# Patient Record
Sex: Female | Born: 1985 | Race: White | Hispanic: No | Marital: Married | Smoking: Current every day smoker
Health system: Southern US, Community
[De-identification: ages and names within clinical notes are randomized; demographics above are authoritative.]

## PROBLEM LIST (undated history)

## (undated) DIAGNOSIS — R51 Headache: Secondary | ICD-10-CM

## (undated) DIAGNOSIS — I639 Cerebral infarction, unspecified: Secondary | ICD-10-CM

## (undated) DIAGNOSIS — S0990XA Unspecified injury of head, initial encounter: Secondary | ICD-10-CM

## (undated) DIAGNOSIS — R569 Unspecified convulsions: Secondary | ICD-10-CM

---

## 2004-01-20 ENCOUNTER — Other Ambulatory Visit: Payer: Self-pay

## 2005-01-17 ENCOUNTER — Emergency Department: Payer: Self-pay | Admitting: Emergency Medicine

## 2006-12-13 ENCOUNTER — Emergency Department: Payer: Self-pay | Admitting: Emergency Medicine

## 2008-12-18 ENCOUNTER — Emergency Department: Payer: Self-pay | Admitting: Emergency Medicine

## 2008-12-20 ENCOUNTER — Emergency Department: Payer: Self-pay | Admitting: Emergency Medicine

## 2009-04-14 ENCOUNTER — Emergency Department: Payer: Self-pay | Admitting: Emergency Medicine

## 2009-11-23 ENCOUNTER — Ambulatory Visit: Payer: Self-pay

## 2010-07-03 ENCOUNTER — Emergency Department: Payer: Self-pay | Admitting: Emergency Medicine

## 2011-05-10 ENCOUNTER — Emergency Department: Payer: Self-pay | Admitting: *Deleted

## 2012-01-23 ENCOUNTER — Emergency Department: Payer: Self-pay | Admitting: Emergency Medicine

## 2012-07-13 ENCOUNTER — Emergency Department: Payer: Self-pay | Admitting: Emergency Medicine

## 2012-07-13 LAB — COMPREHENSIVE METABOLIC PANEL
Albumin: 3.9 g/dL (ref 3.4–5.0)
Alkaline Phosphatase: 63 U/L (ref 50–136)
Creatinine: 0.68 mg/dL (ref 0.60–1.30)
EGFR (African American): >60
EGFR (Non-African Amer.): >60
Glucose: 99 mg/dL (ref 65–99)
SGOT(AST): 20 U/L (ref 15–37)
SGPT (ALT): 19 U/L (ref 12–78)
Total Protein: 7.9 g/dL (ref 6.4–8.2)

## 2012-07-13 LAB — CBC
HCT: 35.2 % (ref 35.0–47.0)
MCH: 26.6 pg (ref 26.0–34.0)
MCV: 81 fL (ref 80–100)
RBC: 4.33 10*6/uL (ref 3.80–5.20)
WBC: 13.9 10*3/uL — ABNORMAL HIGH (ref 3.6–11.0)

## 2012-07-13 LAB — PREGNANCY, URINE: Pregnancy Test, Urine: NEGATIVE m[IU]/mL

## 2012-07-13 LAB — URINALYSIS, COMPLETE
Protein: 100
Specific Gravity: 1.015 (ref 1.003–1.030)
Squamous Epithelial: NONE SEEN
WBC UR: 10 /HPF (ref 0–5)

## 2012-07-14 LAB — WET PREP, GENITAL

## 2012-07-21 ENCOUNTER — Emergency Department: Payer: Self-pay | Admitting: Emergency Medicine

## 2012-07-24 ENCOUNTER — Ambulatory Visit: Payer: Self-pay | Admitting: Urology

## 2012-08-06 ENCOUNTER — Ambulatory Visit: Payer: Self-pay | Admitting: Urology

## 2012-08-19 ENCOUNTER — Ambulatory Visit: Payer: Self-pay | Admitting: Urology

## 2012-09-24 ENCOUNTER — Emergency Department: Payer: Self-pay | Admitting: Emergency Medicine

## 2012-09-24 LAB — URINALYSIS, COMPLETE
Glucose,UR: NEGATIVE mg/dL (ref 0–75)
Ketone: NEGATIVE
Leukocyte Esterase: NEGATIVE
Ph: 8 (ref 4.5–8.0)
RBC,UR: <1 /HPF (ref 0–5)
Specific Gravity: 1.008 (ref 1.003–1.030)
Squamous Epithelial: 8

## 2012-09-24 LAB — TROPONIN I: Troponin-I: <0.02 ng/mL

## 2012-09-24 LAB — DRUG SCREEN, URINE
Benzodiazepine, Ur Scrn: NEGATIVE (ref ?–200)
MDMA (Ecstasy)Ur Screen: NEGATIVE (ref ?–500)
Methadone, Ur Screen: NEGATIVE (ref ?–300)

## 2012-09-24 LAB — COMPREHENSIVE METABOLIC PANEL
Albumin: 4.1 g/dL (ref 3.4–5.0)
Alkaline Phosphatase: 54 U/L (ref 50–136)
Anion Gap: 5 — ABNORMAL LOW (ref 7–16)
BUN: 8 mg/dL (ref 7–18)
Bilirubin,Total: 0.3 mg/dL (ref 0.2–1.0)
Chloride: 107 mmol/L (ref 98–107)
Co2: 25 mmol/L (ref 21–32)
Creatinine: 0.59 mg/dL — ABNORMAL LOW (ref 0.60–1.30)
Glucose: 100 mg/dL — ABNORMAL HIGH (ref 65–99)
Osmolality: 272 (ref 275–301)
Potassium: 3.9 mmol/L (ref 3.5–5.1)
Sodium: 137 mmol/L (ref 136–145)
Total Protein: 8.3 g/dL — ABNORMAL HIGH (ref 6.4–8.2)

## 2012-09-24 LAB — CBC
MCV: 82 fL (ref 80–100)
Platelet: 351 10*3/uL (ref 150–440)
RDW: 16.5 % — ABNORMAL HIGH (ref 11.5–14.5)

## 2012-09-24 LAB — ETHANOL: Ethanol %: <0.003 % (ref 0.000–0.080)

## 2012-09-24 LAB — CK TOTAL AND CKMB (NOT AT ARMC): CK, Total: 91 U/L (ref 21–215)

## 2012-11-14 ENCOUNTER — Encounter (HOSPITAL_COMMUNITY): Payer: Self-pay | Admitting: *Deleted

## 2012-11-14 ENCOUNTER — Inpatient Hospital Stay (HOSPITAL_COMMUNITY)
Admission: EM | Admit: 2012-11-14 | Discharge: 2012-11-18 | DRG: 064 | Discharge status: Against Medical Advice | Payer: Medicaid Other | Attending: Internal Medicine | Admitting: Internal Medicine

## 2012-11-14 DIAGNOSIS — F1510 Other stimulant abuse, uncomplicated: Secondary | ICD-10-CM | POA: Diagnosis present

## 2012-11-14 DIAGNOSIS — R930 Abnormal findings on diagnostic imaging of skull and head, not elsewhere classified: Secondary | ICD-10-CM

## 2012-11-14 DIAGNOSIS — G9340 Encephalopathy, unspecified: Secondary | ICD-10-CM | POA: Diagnosis present

## 2012-11-14 DIAGNOSIS — I639 Cerebral infarction, unspecified: Secondary | ICD-10-CM | POA: Diagnosis present

## 2012-11-14 DIAGNOSIS — R569 Unspecified convulsions: Secondary | ICD-10-CM | POA: Diagnosis present

## 2012-11-14 DIAGNOSIS — R4182 Altered mental status, unspecified: Secondary | ICD-10-CM

## 2012-11-14 DIAGNOSIS — F1210 Cannabis abuse, uncomplicated: Secondary | ICD-10-CM | POA: Diagnosis present

## 2012-11-14 DIAGNOSIS — I6350 Cerebral infarction due to unspecified occlusion or stenosis of unspecified cerebral artery: Principal | ICD-10-CM | POA: Diagnosis present

## 2012-11-14 DIAGNOSIS — F1410 Cocaine abuse, uncomplicated: Secondary | ICD-10-CM | POA: Diagnosis present

## 2012-11-14 DIAGNOSIS — F445 Conversion disorder with seizures or convulsions: Secondary | ICD-10-CM | POA: Diagnosis present

## 2012-11-14 DIAGNOSIS — G43909 Migraine, unspecified, not intractable, without status migrainosus: Secondary | ICD-10-CM | POA: Diagnosis present

## 2012-11-14 HISTORY — DX: Unspecified injury of head, initial encounter: S09.90XA

## 2012-11-14 HISTORY — DX: Cerebral infarction, unspecified: I63.9

## 2012-11-14 HISTORY — DX: Headache: R51

## 2012-11-14 HISTORY — DX: Unspecified convulsions: R56.9

## 2012-11-14 LAB — CBC
HCT: 35.7 % — ABNORMAL LOW (ref 36.0–46.0)
Hemoglobin: 12.2 g/dL (ref 12.0–15.0)
MCHC: 34.2 g/dL (ref 30.0–36.0)
MCV: 79.2 fL (ref 78.0–100.0)

## 2012-11-14 LAB — BASIC METABOLIC PANEL
BUN: 14 mg/dL (ref 6–23)
Chloride: 104 mEq/L (ref 96–112)
Glucose, Bld: 92 mg/dL (ref 70–99)
Potassium: 3.6 mEq/L (ref 3.5–5.1)

## 2012-11-14 LAB — GLUCOSE, CAPILLARY

## 2012-11-14 LAB — RAPID URINE DRUG SCREEN, HOSP PERFORMED
Amphetamines: NOT DETECTED
Barbiturates: NOT DETECTED
Benzodiazepines: NOT DETECTED

## 2012-11-14 LAB — ETHANOL: Alcohol, Ethyl (B): <11 mg/dL (ref 0–11)

## 2012-11-14 MED ORDER — LORAZEPAM 2 MG/ML IJ SOLN
1.0000 mg | Freq: Once | INTRAMUSCULAR | Status: AC
  Administered 2012-11-14: 1 mg via INTRAVENOUS
  Filled 2012-11-14: qty 1

## 2012-11-14 NOTE — ED Notes (Addendum)
~   1000: started babbling yesterday morning; corner of rt. Cheek twitching. Lasted x 2 hrs. Today: ~1030, 1800: x 2 seizure like symptoms. Dx. With seizures and anxiety attacks. X 3 mos. Had similar event, went to hospital and dx. Head bleed.

## 2012-11-14 NOTE — ED Notes (Signed)
Pt moving all extremities on own with no difficulty. Asked husband for water to drink. Pt informed if head CT looks good she can have something to drink after that.

## 2012-11-15 ENCOUNTER — Observation Stay (HOSPITAL_COMMUNITY): Payer: Medicaid Other

## 2012-11-15 ENCOUNTER — Encounter (HOSPITAL_COMMUNITY): Payer: Self-pay | Admitting: Internal Medicine

## 2012-11-15 ENCOUNTER — Emergency Department (HOSPITAL_COMMUNITY): Payer: Medicaid Other

## 2012-11-15 DIAGNOSIS — G43909 Migraine, unspecified, not intractable, without status migrainosus: Secondary | ICD-10-CM | POA: Diagnosis present

## 2012-11-15 DIAGNOSIS — R930 Abnormal findings on diagnostic imaging of skull and head, not elsewhere classified: Secondary | ICD-10-CM

## 2012-11-15 DIAGNOSIS — R4182 Altered mental status, unspecified: Secondary | ICD-10-CM

## 2012-11-15 DIAGNOSIS — G9340 Encephalopathy, unspecified: Secondary | ICD-10-CM

## 2012-11-15 DIAGNOSIS — F445 Conversion disorder with seizures or convulsions: Secondary | ICD-10-CM | POA: Diagnosis present

## 2012-11-15 LAB — BASIC METABOLIC PANEL
CO2: 23 mEq/L (ref 19–32)
Calcium: 9 mg/dL (ref 8.4–10.5)
Creatinine, Ser: 0.7 mg/dL (ref 0.50–1.10)
GFR calc Af Amer: >90 mL/min (ref >90)
Sodium: 138 mEq/L (ref 135–145)

## 2012-11-15 LAB — CBC
MCV: 79.4 fL (ref 78.0–100.0)
Platelets: 321 10*3/uL (ref 150–400)
RBC: 3.94 MIL/uL (ref 3.87–5.11)
RDW: 16.4 % — ABNORMAL HIGH (ref 11.5–15.5)
WBC: 8.4 10*3/uL (ref 4.0–10.5)

## 2012-11-15 LAB — URINE MICROSCOPIC-ADD ON

## 2012-11-15 LAB — ANTITHROMBIN III: AntiThromb III Func: 102 % (ref 75–120)

## 2012-11-15 LAB — URINALYSIS, ROUTINE W REFLEX MICROSCOPIC
Leukocytes, UA: NEGATIVE
Nitrite: NEGATIVE
Specific Gravity, Urine: 1.031 — ABNORMAL HIGH (ref 1.005–1.030)
pH: 6 (ref 5.0–8.0)

## 2012-11-15 LAB — HOMOCYSTEINE: Homocysteine: 8.1 umol/L (ref 4.0–15.4)

## 2012-11-15 MED ORDER — ACETAMINOPHEN 325 MG PO TABS
650.0000 mg | ORAL_TABLET | Freq: Four times a day (QID) | ORAL | Status: DC | PRN
  Administered 2012-11-15 – 2012-11-16 (×2): 650 mg via ORAL
  Filled 2012-11-15 (×2): qty 2

## 2012-11-15 MED ORDER — HYDROMORPHONE HCL PF 1 MG/ML IJ SOLN: 0.5000 mg | INTRAMUSCULAR | Status: DC | PRN

## 2012-11-15 MED ORDER — ONDANSETRON HCL 4 MG/2ML IJ SOLN: 4.0000 mg | Freq: Four times a day (QID) | INTRAMUSCULAR | Status: DC | PRN

## 2012-11-15 MED ORDER — ZOLPIDEM TARTRATE 5 MG PO TABS: 5.0000 mg | ORAL_TABLET | Freq: Every evening | ORAL | Status: DC | PRN

## 2012-11-15 MED ORDER — OXYCODONE HCL 5 MG PO TABS
5.0000 mg | ORAL_TABLET | ORAL | Status: DC | PRN
  Administered 2012-11-15 – 2012-11-17 (×5): 5 mg via ORAL
  Filled 2012-11-15 (×5): qty 1

## 2012-11-15 MED ORDER — ENOXAPARIN SODIUM 150 MG/ML ~~LOC~~ SOLN: 1.0000 mg/kg | Freq: Two times a day (BID) | SUBCUTANEOUS | Status: DC

## 2012-11-15 MED ORDER — LORAZEPAM 2 MG/ML IJ SOLN: 1.0000 mg | INTRAMUSCULAR | Status: DC | PRN

## 2012-11-15 MED ORDER — SODIUM CHLORIDE 0.9 % IV SOLN
INTRAVENOUS | Status: DC
  Administered 2012-11-15 – 2012-11-16 (×2): via INTRAVENOUS

## 2012-11-15 MED ORDER — SODIUM CHLORIDE 0.9 % IV SOLN: INTRAVENOUS | Status: AC

## 2012-11-15 MED ORDER — SENNOSIDES-DOCUSATE SODIUM 8.6-50 MG PO TABS: 1.0000 | ORAL_TABLET | Freq: Every evening | ORAL | Status: DC | PRN

## 2012-11-15 MED ORDER — ONDANSETRON HCL 4 MG PO TABS: 4.0000 mg | ORAL_TABLET | Freq: Four times a day (QID) | ORAL | Status: DC | PRN

## 2012-11-15 MED ORDER — ACETAMINOPHEN 650 MG RE SUPP: 650.0000 mg | Freq: Four times a day (QID) | RECTAL | Status: DC | PRN

## 2012-11-15 MED ORDER — ALUM & MAG HYDROXIDE-SIMETH 200-200-20 MG/5ML PO SUSP
30.0000 mL | Freq: Four times a day (QID) | ORAL | Status: DC | PRN
Start: 2012-11-15 — End: 2012-11-18
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
## 2012-11-15 NOTE — Progress Notes (Signed)
Critical value MRI result called to this RN. Paged Dr Roseanne Reno and he has reviewed the test and is aware of results. Regine Christian, [place] Marie, RN
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
## 2012-11-15 NOTE — Progress Notes (Signed)
Pt passed swallow screen with no problems.  Placed on heart healthy diet per previous order.

## 2012-11-15 NOTE — ED Provider Notes (Signed)
History     CSN: [id number]  Arrival date & time 11/14/12  2022   First MD Initiated Contact with Patient 11/14/12 2057      Chief Complaint  Patient presents with  . Altered Mental Status  . Seizures     The history is provided by the spouse. History limited by: Level 5 caveat: Altered mental status.   the majority of the history is obtained from the spouse.  It sounds as though the patient yesterday developed altered mental status and some abnormal speech patterns.  Most of the day the patient has had abnormal speech described as slurring at times by the spouse.  Is also reported that she maybe had 2 seizure-like episodes today.  As reported that she has a seizure disorder but does not take any medications.  He also reports to me that 3 months ago she was admitted the hospital after an episode of passing out at which point they stated that she had bleeding in her head.  The patient left the hospital without a clear diagnosis and prior to her complete workup being finished.  Spouse is reported the patient has a history of anxiety.  He reports that today she's been confused most the day.  He denies illicit drug use for her.  The patient is unable to provide any additional history and will only mumble a few words.  She'll not make direct eye contact.  Past Medical History  Diagnosis Date  . Seizures   . Head trauma     Past Surgical History  Procedure Laterality Date  . Cesarean section      No family history on file.  History  Substance Use Topics  . Smoking status: Not on file  . Smokeless tobacco: Not on file  . Alcohol Use: Yes    OB History   Grav Para Term Preterm Abortions TAB SAB Ect Mult Living                  Review of Systems  Unable to perform ROS: Mental status change  Neurological: Positive for seizures.  Psychiatric/Behavioral: Positive for altered mental status.    Allergies  Bee venom and Penicillins  Home Medications  No current outpatient  prescriptions on file.  BP 109/66  Pulse 69  Temp(Src) 98.8 F (37.1 C) (Oral)  Resp 20  SpO2 96%  LMP 10/15/2012  Physical Exam  Nursing note and vitals reviewed. Constitutional: She appears well-developed and well-nourished. No distress.  HENT:  Head: Normocephalic and atraumatic.  Eyes: EOM are normal.  Neck: Normal range of motion.  Cardiovascular: Normal rate, regular rhythm and normal heart sounds.   Pulmonary/Chest: Effort normal and breath sounds normal.  Abdominal: Soft. She exhibits no distension. There is no tenderness.  Musculoskeletal: Normal range of motion.  Neurological:  Opens eyes to voice and painful stimuli.  We'll move extremities grossly however has weakness of her upper lower extremities but will not strike herself in the face with either hand when her hands lifted above her head.  Skin: Skin is warm and dry.  Psychiatric: She has a normal mood and affect. Judgment normal.    ED Course  Procedures (including critical care time)  Labs Reviewed  GLUCOSE, CAPILLARY - Abnormal; Notable for the following:    Glucose-Capillary 106 (*)    All other components within normal limits  CBC - Abnormal; Notable for the following:    WBC 10.8 (*)    HCT 35.7 (*)    RDW  16.6 (*)    All other components within normal limits  URINE RAPID DRUG SCREEN (HOSP PERFORMED) - Abnormal; Notable for the following:    Tetrahydrocannabinol POSITIVE (*)    All other components within normal limits  BASIC METABOLIC PANEL  ETHANOL   Ct Head Wo Contrast  11/15/2012  *RADIOLOGY REPORT*  Clinical Data: Seizure  CT HEAD WITHOUT CONTRAST  Technique:  Contiguous axial images were obtained from the base of the skull through the vertex without contrast.  Comparison: None.  Findings: Focal hypoattenuation centered at the genu and posterior limb of the left internal capsule/anterior margin of the thalamus. No intraparenchymal hemorrhage, mass, mass effect, or abnormal extra-axial fluid  collection.  No hydrocephalous. The visualized paranasal sinuses and mastoid air cells are predominately clear.  IMPRESSION: Nonspecific hypodensity left basal ganglia, favored to reflect sequelae of prior infarct/hemorrhage.  Recommend correlation with outside imaging and if clinical concern for acute pathology persists, MRI recommended.  Discussed via telephone with Dr. Patria Mane at 12:05 a.m. on 11/15/2012.   Original Report Authenticated By: [name], M.D.    I personally reviewed the imaging tests through PACS system I reviewed available ER/hospitalization records through the EMR   No diagnosis found.    MDM  Neurology to assist in this difficult situation.  I suspect this is a chronic finding in her head and that this is nonacute.  Her symptoms seem more consistent with somatic disorder versus non-atelectatic seizure.  Will await neuro consultation for additional disposition decisions.        Lyanne Co, MD 11/15/12 [phone]
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
## 2012-11-15 NOTE — ED Provider Notes (Signed)
1:40 AM patient is alert speech is slurred, moves all extremities spoke with Dr. Amada Jupiter who suggests admission to medical service MRI.MRA scan of the brain and EEG. Spoke with Dr. Lovell Sheehan will arrange for inpatient stay  Doug Sou, MD 11/15/12 [phone]

## 2012-11-15 NOTE — Progress Notes (Signed)
Subjective: This is an unfortunate 27 year old female who was admitted to [facility] early this morning for possible seizure activity. Please refer to Dr. Alene Mires full consult note from 2:30 this morning. An MRI/MRA was recommended as part of the patient's workup. The MRI has come back consistent with a CVA. We were contacted by the hospitalists who reported that the patient appeared to be very lethargic and were asked to reevaluate her this afternoon. There were no family members present when seen.   From Dr Alene Mires note:  "Sheri Woods is a 27 y.o. 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 She was previously treated with antiepileptic medications, but has not been on one for 1.5 years. She had an episode 3 months ago, the details of which are not clear because the husband was not with her at that time, that prompted her to go to [facility] where she was told that she had a brain hemorrhage. She left AMA prior to being fully evaluated. And recovered over the next few days."   Objective: Current vital signs: BP 119/71  Pulse 59  Temp(Src) 98 F (36.7 C) (Oral)  Resp 17  Ht 5\' 1"  (1.549 m)  Wt 49 kg (108 lb 0.4 oz)  BMI 20.42 kg/m2  SpO2 99%  LMP 10/15/2012 Vital signs in last 24 hours: Temp:  [98 F (36.7 C)-98.8 F (37.1 C)] 98 F (36.7 C) (05/04 0800) Pulse Rate:  [59-86] 59 (05/04 1332) Resp:  [11-20] 17 (05/04 1332) BP: (107-141)/(66-94) 119/71 mmHg (05/04 1332) SpO2:  [93 %-100 %] 99 % (05/04 1332) Weight:  [49 kg (108 lb 0.4 oz)] 49 kg (108 lb 0.4 oz) (05/04 0507)  Intake/Output from previous day: 05/03 0701 - 05/04 0700 In: 120 [P.O.:120] Out: -  Intake/Output this shift:   Nutritional status: Cardiac  Physical Exam  General - this is a tearful 27 year old female lying in bed and somewhat uncooperative with the exam. Heart - Regular rate and rhythm - no murmer Lungs - Clear to auscultation Abdomen -  Soft - non tender Extremities - Distal pulses intact - no edema Skin - Warm and dry  Neurologic Exam:  MENTAL STATUS: The patient was initially lethargic although it appeared that she chose not to communicate. With continued prompting the patient became more cooperative. She appeared to be somewhat confused and was unable to tell me the name of the city although she knew she was in the hospital. She told me she had no idea of the current date. Some conversation appear to be nonsensical. She was able to follow some commands; although, at other times she expressed inability to carry out some tasks before she even made an attempt. CRANIAL NERVES: pupils equal and reactive to light, visual fields full to confrontation, extraocular muscles intact, no nystagmus, facial strength symmetric, uvula midline, tongue midline. The patient's speech was somewhat difficult to understand. Question dysarthria versus speech impediment. The patient was also very soft-spoken. MOTOR: normal bulk and tone, the patient told me that she was unable to lift her arms or her legs. When I lifted her arms she was able to them up against gravity. When I lifted her legs she was unable to keep them elevated. SENSORY: The patient reported decreased sensation in the left upper extremity and decreased sensation in the right lower extremity. COORDINATION: finger-nose-finger normal but very slow. REFLEXES: deep tendon reflexes present, symmetric and brisk - no babinski bilaterally. GAIT/STATION: Deferred  Lab Results: Basic Metabolic Panel:  Recent Labs Lab 11/14/12 2040 11/15/12 0500  NA 140 138  K 3.6 3.6  CL 104 104  CO2 25 23  GLUCOSE 92 83  BUN 14 14  CREATININE 0.72 0.70  CALCIUM 9.9 9.0    Liver Function Tests: No results found for this basename: AST, ALT, ALKPHOS, BILITOT, PROT, ALBUMIN,  in the last 168 hours No results found for this basename: LIPASE, AMYLASE,  in the last 168 hours No results found for this  basename: AMMONIA,  in the last 168 hours  CBC:  Recent Labs Lab 11/14/12 2040 11/15/12 0500  WBC 10.8* 8.4  HGB 12.2 10.7*  HCT 35.7* 31.3*  MCV 79.2 79.4  PLT 362 321    Cardiac Enzymes: No results found for this basename: CKTOTAL, CKMB, CKMBINDEX, TROPONINI,  in the last 168 hours  Lipid Panel: No results found for this basename: CHOL, TRIG, HDL, CHOLHDL, VLDL, LDLCALC,  in the last 168 hours  CBG:  Recent Labs Lab 11/14/12 2033  GLUCAP 106*    Microbiology: No results found for this or any previous visit.  Coagulation Studies: No results found for this basename: LABPROT, INR,  in the last 72 hours  Imaging:  Dg Chest 2 View 11/15/2012  No active disease.  Mild elevation of the right hemidiaphragm.   Ct Head Wo Contrast 11/15/2012  Nonspecific hypodensity left basal ganglia, favored to reflect sequelae of prior infarct/hemorrhage.  Recommend correlation with outside imaging and if clinical concern for acute pathology persists, MRI recommended.    Mr Maxine Glenn Head Wo Contrast 11/15/12 Branch vessel irregularity.  This may reflect limitation of the present exam.  Intracranial atherosclerotic type changes or vasculitis not entirely excluded.  Asymmetric caliber of the internal carotid arteries, larger on the left. Etiology indeterminate.   No pleated appearance of the left internal carotid artery as can be seen with fibromuscular dysplasia.    Mr Brain Wo Contrast 11/15/2012   Abnormal exam with appearance suggestive of acute/subacute infarct involving the left thalamus/posterior limb left internal capsule junction.  This extends to the genu of the internal capsule and there is mild mass effect upon the third ventricle.     Medications:  Scheduled: . sodium chloride   Intravenous STAT    Assessment/Plan:  I discussed the results of the MRI with Dr. Roseanne Reno. He did not feel that there was any need to be overly concerned with the findings at this time. The patient will  need a full stroke workup as well as evaluation from the therapists.   We will have the stroke service evaluate the patient tomorrow for further recommendations. Consider starting aspirin.  As noted the patient was tearful through most of the interview today. I asked her if she was upset about being in the hospital. She denied this as a cause of her distress. She apparently did not want to talk about what is bothering her. I offered to have her meet with a pastor or another counselor; although, she declined at this time.  Delton See PA-C [facility] Pager [phone]  I personally participated in this patient's evaluation and management including the above assessment and management recommendations.   Venetia Maxon M.D. [facility] [phone]  11/15/2012, 3:17 PM

## 2012-11-15 NOTE — H&P (Signed)
[facility] History and Physical  Sheri Woods AVW:[id number] DOB: 1985/07/17 DOA: 11/14/2012  Referring physician:  EDP PCP: No PCP Per Patient  Specialists:   Chief Complaint:  Confusion  HPI: Sheri Woods is a 27 y.o. female who was brought to the ED due to confusion and "talking out of her head" since yesterday according to her husband.  The episodes have been intermittent occuring 3 separate times. She has not had fevers or chills or nausea or vomiting.  She has a history of Migraine headaches, and Seizures Versus Pseudoseizures, and was diagnosed with an Intracranial bleed 3 months ago at [facility].   She was evaluated in the ED and seen by Neurologist Dr. Amada Jupiter and recommendations were made for further evaluations which include an EEG study and an MRI/MRA of the Brain.   The history has been obtained from the husband, patient is confused and unable to give a history at this time.     Review of Systems: The patient denies anorexia, fever,chills, weight loss, vision loss, decreased hearing, hoarseness, chest pain, syncope, dyspnea on exertion, peripheral edema, balance deficits, hemoptysis, abdominal pain,nausea, vomiting, diarrhea, constipation, melena, hematochezia, severe indigestion/heartburn, hematuria, incontinence, dysuria, muscle weakness, suspicious skin lesions, transient blindness, difficulty walking, depression, unusual weight change, abnormal bleeding, enlarged lymph nodes, angioedema, and breast masses.    Past Medical History  Diagnosis Date  . Seizures   . Head trauma     Past Surgical History  Procedure Laterality Date  . Cesarean section       Medications:  HOME MEDS: Prior to Admission medications   Not on File    Allergies:  Allergies  Allergen Reactions  . Bee Venom Hives  . Penicillins Other (See Comments)    Reaction unknown per family     Social History:   reports that  drinks alcohol. She reports that she does  not use illicit drugs. Her tobacco history is not on file.   Family History:  Not Known to Husband    Physical Exam:  GEN:  Thin Caucasian Female examined  and in no acute distress; cooperative with exam Filed Vitals:   11/14/12 2200 11/14/12 2230 11/14/12 2244 11/15/12 0246  BP:   109/66 109/67  Pulse: 67 71 69 65  Temp:      TempSrc:      Resp: 16 19 20 16   SpO2: 100% 99% 96% 98%   Blood pressure 109/67, pulse 65, temperature 98.8 F (37.1 C), temperature source Oral, resp. rate 16, last menstrual period 10/15/2012, SpO2 98.00%. PSYCH: She is alert and oriented x4;  Her speech is slow and deliberate.   does not appear anxious does not appear depressed; affect is normal HEENT: Normocephalic and Atraumatic, Mucous membranes pink; PERRLA; EOM intact; Fundi:  Benign;  No scleral icterus, Nares: Patent, Oropharynx: Clear, Fair Dentition, Neck:  FROM, no cervical lymphadenopathy nor thyromegaly or carotid bruit; no JVD; Breasts:: Not examined CHEST WALL: No tenderness CHEST: Normal respiration, clear to auscultation bilaterally HEART: Regular rate and rhythm; no murmurs rubs or gallops BACK: No kyphosis or scoliosis; no CVA tenderness ABDOMEN: Positive Bowel Sounds, Scaphoid,Soft non-tender; no masses, no organomegaly.    Rectal Exam: Not done EXTREMITIES: No cyanosis, clubbing or edema; no ulcerations. Genitalia: not examined PULSES: 2+ and symmetric SKIN: Normal hydration no rash or ulceration CNS: Cranial nerves 2-12 grossly intact no focal neurologic deficit     Labs & Imaging Results for orders placed during the hospital encounter of 11/14/12 (from the  past 48 hour(s))  GLUCOSE, CAPILLARY     Status: Abnormal   Collection Time    11/14/12  8:33 PM      Result Value Range   Glucose-Capillary 106 (*) 70 - 99 mg/dL   Comment 1 Documented in Chart     Comment 2 Notify RN    CBC     Status: Abnormal   Collection Time    11/14/12  8:40 PM      Result Value Range   WBC  10.8 (*) 4.0 - 10.5 K/uL   RBC 4.51  3.87 - 5.11 MIL/uL   Hemoglobin 12.2  12.0 - 15.0 g/dL   HCT 29.5 (*) 62.1 - 30.8 %   MCV 79.2  78.0 - 100.0 fL   MCH 27.1  26.0 - 34.0 pg   MCHC 34.2  30.0 - 36.0 g/dL   RDW 65.7 (*) 84.6 - 96.2 %   Platelets 362  150 - 400 K/uL  BASIC METABOLIC PANEL     Status: None   Collection Time    11/14/12  8:40 PM      Result Value Range   Sodium 140  135 - 145 mEq/L   Potassium 3.6  3.5 - 5.1 mEq/L   Chloride 104  96 - 112 mEq/L   CO2 25  19 - 32 mEq/L   Glucose, Bld 92  70 - 99 mg/dL   BUN 14  6 - 23 mg/dL   Creatinine, Ser 9.52  0.50 - 1.10 mg/dL   Calcium 9.9  8.4 - 84.1 mg/dL   GFR calc non Af Amer >90  >90 mL/min   GFR calc Af Amer >90  >90 mL/min   Comment:            The eGFR has been calculated     using the CKD EPI equation.     This calculation has not been     validated in all clinical     situations.     eGFR's persistently     <90 mL/min signify     possible Chronic Kidney Disease.  ETHANOL     Status: None   Collection Time    11/14/12  8:40 PM      Result Value Range   Alcohol, Ethyl (B) <11  0 - 11 mg/dL   Comment:            LOWEST DETECTABLE LIMIT FOR     SERUM ALCOHOL IS 11 mg/dL     FOR MEDICAL PURPOSES ONLY  URINE RAPID DRUG SCREEN (HOSP PERFORMED)     Status: Abnormal   Collection Time    11/14/12  9:57 PM      Result Value Range   Opiates NONE DETECTED  NONE DETECTED   Cocaine NONE DETECTED  NONE DETECTED   Benzodiazepines NONE DETECTED  NONE DETECTED   Amphetamines NONE DETECTED  NONE DETECTED   Tetrahydrocannabinol POSITIVE (*) NONE DETECTED   Barbiturates NONE DETECTED  NONE DETECTED   Comment:            DRUG SCREEN FOR MEDICAL PURPOSES     ONLY.  IF CONFIRMATION IS NEEDED     FOR ANY PURPOSE, NOTIFY LAB     WITHIN 5 DAYS.                LOWEST DETECTABLE LIMITS     FOR URINE DRUG SCREEN     Drug Class       Cutoff (ng/mL)  Amphetamine      1000     Barbiturate      200     Benzodiazepine   200      Tricyclics       300     Opiates          300     Cocaine          300     THC              50     Radiological Exams on Admission: Ct Head Wo Contrast  11/15/2012  *RADIOLOGY REPORT*  Clinical Data: Seizure  CT HEAD WITHOUT CONTRAST  Technique:  Contiguous axial images were obtained from the base of the skull through the vertex without contrast.  Comparison: None.  Findings: Focal hypoattenuation centered at the genu and posterior limb of the left internal capsule/anterior margin of the thalamus. No intraparenchymal hemorrhage, mass, mass effect, or abnormal extra-axial fluid collection.  No hydrocephalous. The visualized paranasal sinuses and mastoid air cells are predominately clear.  IMPRESSION: Nonspecific hypodensity left basal ganglia, favored to reflect sequelae of prior infarct/hemorrhage.  Recommend correlation with outside imaging and if clinical concern for acute pathology persists, MRI recommended.  Discussed via telephone with Dr. Patria Mane at 12:05 a.m. on 11/15/2012.   Original Report Authenticated By: [name], M.D.       Assessment/Plan Principal Problem:   Encephalopathy acute Active Problems:   Altered mental status   Abnormal CT scan of head   Pseudoseizures   Migraine headache   1.   Acute Encephalopathy-  ? Seizure Related and post ictal-  Seizure Precautions and Workup initiated,  Neuro checks, and MRI/MRA and EEG in AM.   PRN IV Ativan.  2.   Abnormal CT scan of the brain- Previous ICH 3 months ago, MRI/MRA ordered, and request records from [place].    3.  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    4.  Migraine HAs-  ? Hx of Complex Migraines.   PRN Pain Medication.    5.  DVt prophylaxis with SCDs.       Code Status:  FULL CODE Family Communication:  Family at Bedside Disposition Plan:  Return To Home  Time spent:  72 Minutes  Ron Parker [facility] Pager [phone]  If 7PM-7AM, please contact night-coverage [email or web address] Password  [email or web address] 11/15/2012, 3:30 AM

## 2012-11-15 NOTE — Progress Notes (Signed)
Utilization Review Completed.   Sanaz Scarlett, RN, BSN Nurse Case Manager  [phone]  

## 2012-11-16 ENCOUNTER — Observation Stay (HOSPITAL_COMMUNITY): Payer: Medicaid Other

## 2012-11-16 DIAGNOSIS — G9340 Encephalopathy, unspecified: Secondary | ICD-10-CM

## 2012-11-16 DIAGNOSIS — I6350 Cerebral infarction due to unspecified occlusion or stenosis of unspecified cerebral artery: Secondary | ICD-10-CM

## 2012-11-16 DIAGNOSIS — I639 Cerebral infarction, unspecified: Secondary | ICD-10-CM | POA: Diagnosis present

## 2012-11-16 DIAGNOSIS — F1410 Cocaine abuse, uncomplicated: Secondary | ICD-10-CM | POA: Diagnosis present

## 2012-11-16 DIAGNOSIS — F1510 Other stimulant abuse, uncomplicated: Secondary | ICD-10-CM | POA: Diagnosis present

## 2012-11-16 DIAGNOSIS — R4182 Altered mental status, unspecified: Secondary | ICD-10-CM

## 2012-11-16 LAB — PROTEIN S, TOTAL: Protein S Ag, Total: 71 % (ref 60–150)

## 2012-11-16 LAB — ANTI-SCLERODERMA ANTIBODY: Scleroderma (Scl-70) (ENA) Antibody, IgG: <1 AU/mL (ref <30)

## 2012-11-16 LAB — LIPID PANEL
Cholesterol: 136 mg/dL (ref 0–200)
HDL: 38 mg/dL — ABNORMAL LOW (ref >39)
Total CHOL/HDL Ratio: 3.6 RATIO

## 2012-11-16 LAB — HEMOGLOBIN A1C: Mean Plasma Glucose: 120 mg/dL — ABNORMAL HIGH (ref <117)

## 2012-11-16 LAB — LUPUS ANTICOAGULANT PANEL
DRVVT: 36.3 secs (ref <42.9)
PTT Lupus Anticoagulant: 37.1 secs (ref 28.0–43.0)

## 2012-11-16 LAB — FACTOR 5 LEIDEN

## 2012-11-16 LAB — ANA: Anti Nuclear Antibody(ANA): NEGATIVE

## 2012-11-16 LAB — CARDIOLIPIN ANTIBODIES, IGG, IGM, IGA
Anticardiolipin IgA: 3 APL U/mL — ABNORMAL LOW (ref <22)
Anticardiolipin IgM: 1 MPL U/mL — ABNORMAL LOW (ref <11)

## 2012-11-16 NOTE — Progress Notes (Signed)
Speaking with patient this morning, she stated that she has changed her mind and now does not want her aunt to be given information about her care.

## 2012-11-16 NOTE — Plan of Care (Signed)
Problem: Phase II Progression Outcomes Goal: Discharge plan established No follow up OT recommended at this time after acute care d/c     

## 2012-11-16 NOTE — Progress Notes (Signed)
EEG  Completed; results pending. 

## 2012-11-16 NOTE — Progress Notes (Signed)
[facility] PROGRESS NOTE  Assessment/Plan: Encephalopathy acute - ct head 5.3.2014: hypodensity left basal ganglia - UDS cannabis.patient admitted to cocaine < 1 week ago and amphetamines Dc'd IV naroctic and BZD's - neurology consulted. - MRI positive for a CVA, ANA, hypercoagulable panel.  Pseudoseizures - none here, on telemetry. - check a prolactin level with in 2- minutes if she has seiZures. - EEG.   Migraine headache - PRN medication, avoid narcotics.    Code Status: full Family Communication: boyfriend  Disposition Plan: home in 1-2 days   Consultants:  neuro  Procedures:  MRI  EEG  echo  Antibiotics:  None  HPI/Subjective: Boyfriend  And patient. Admitted to use drugs  Objective: Filed Vitals:   11/15/12 2200 11/16/12 0200 11/16/12 0600 11/16/12 0958  BP: 126/71 131/75 133/73 127/84  Pulse: 68 64 65 58  Temp: 98.9 F (37.2 C) 98.1 F (36.7 C) 98 F (36.7 C) 97.5 F (36.4 C)  TempSrc:    Oral  Resp: 16 16 16 18   Height:      Weight:      SpO2: 100% 100% 100% 100%   No intake or output data in the 24 hours ending 11/16/12 1043 Filed Weights   11/15/12 0507  Weight: 49 kg (108 lb 0.4 oz)    Exam:  General: Alert, awake, oriented x2, in no acute distress.  HEENT: No bruits, no goiter.  Heart: Regular rate and rhythm, without murmurs, rubs, gallops.  Lungs: Good air movement, bilateral air movement.  Abdomen: Soft, nontender, nondistended, positive bowel sounds.  Neuro: Grossly intact, nonfocal.   Data Reviewed: Basic Metabolic Panel:  Recent Labs Lab 11/14/12 2040 11/15/12 0500  NA 140 138  K 3.6 3.6  CL 104 104  CO2 25 23  GLUCOSE 92 83  BUN 14 14  CREATININE 0.72 0.70  CALCIUM 9.9 9.0   Liver Function Tests: No results found for this basename: AST, ALT, ALKPHOS, BILITOT, PROT, ALBUMIN,  in the last 168 hours No results found for this basename: LIPASE, AMYLASE,  in the last 168 hours No results found for this  basename: AMMONIA,  in the last 168 hours CBC:  Recent Labs Lab 11/14/12 2040 11/15/12 0500  WBC 10.8* 8.4  HGB 12.2 10.7*  HCT 35.7* 31.3*  MCV 79.2 79.4  PLT 362 321   Cardiac Enzymes: No results found for this basename: CKTOTAL, CKMB, CKMBINDEX, TROPONINI,  in the last 168 hours BNP (last 3 results) No results found for this basename: PROBNP,  in the last 8760 hours CBG:  Recent Labs Lab 11/14/12 2033  GLUCAP 106*    No results found for this or any previous visit (from the past 240 hour(s)).   Studies: Dg Chest 2 View  11/15/2012  *RADIOLOGY REPORT*  Clinical Data: Stroke  CHEST - 2 VIEW  Comparison: None.  Findings: Cardiomediastinal silhouette is unremarkable.  Mild elevation of the right hemidiaphragm.  No acute infiltrate or pleural effusion.  No pulmonary edema.  IMPRESSION: No active disease.  Mild elevation of the right hemidiaphragm.   Original Report Authenticated By: [name], M.D.    Ct Head Wo Contrast  11/15/2012  *RADIOLOGY REPORT*  Clinical Data: Seizure  CT HEAD WITHOUT CONTRAST  Technique:  Contiguous axial images were obtained from the base of the skull through the vertex without contrast.  Comparison: None.  Findings: Focal hypoattenuation centered at the genu and posterior limb of the left internal capsule/anterior margin of the thalamus. No intraparenchymal hemorrhage, mass, mass  effect, or abnormal extra-axial fluid collection.  No hydrocephalous. The visualized paranasal sinuses and mastoid air cells are predominately clear.  IMPRESSION: Nonspecific hypodensity left basal ganglia, favored to reflect sequelae of prior infarct/hemorrhage.  Recommend correlation with outside imaging and if clinical concern for acute pathology persists, MRI recommended.  Discussed via telephone with Dr. Patria Mane at 12:05 a.m. on 11/15/2012.   Original Report Authenticated By: [name], M.D.    Mr [facility] Wo Contrast  11/15/2012  *RADIOLOGY REPORT*  Clinical Data:  27  year old female with possible seizures versus pseudo seizures for past 3-4 years. Off seizure medication for past 18 months.  3 months ago seizure episode and evaluated at outside hospital and told she had brain hemorrhage.  The patient left against medical advice and recovered within next few days.  Recent development of abnormal speech.  MRI BRAIN WITHOUT CONTRAST MRA HEAD WITHOUT CONTRAST  Technique: Multiplanar, multiecho pulse sequences of the brain and surrounding structures were obtained according to standard protocol without intravenous contrast.  Angiographic images of the head were obtained using MRA technique without contrast.  Comparison: 11/14/2012 head CT.  No earlier exams available.  MRI HEAD  Findings:  Motion degraded exam.  At the anterior lateral border of the left thalamus and adjacent medial aspect of the posterior limb of the left internal capsule there is an area of altered signal intensity demonstrating restricted motion but also seen on T2-weighted imaging with mild local mass effect upon the third ventricle.  Findings most consistent with an acute/subacute infarct.  Other causes for the above described finding (such as result of demyelinating process, infectious process, inflammatory process, result of toxic exposure, post-traumatic or tumor) are felt to be less likely considerations. However, infarct in a patient of this age is unusual and if the patient did not have the typical course for that of an infarct then this can be reevaluated on follow-up MR with contrast (it would be helpful if possible to allow time for infarct to demonstrate expected evolutionary prior to obtaining this follow-up MR).  There are no blood breakdown products in the above described findings to suggest that this is result of prior hemorrhage.  Slight altered signal intensity bone marrow clivus may be related to underlying anemia.  High resolution T2-weighted imaging to evaluate for mesial temporal sclerosis was  not able to be obtained because of patient motion. On the coronal T2 weighted standard sequences obtained, no mesial temporal sclerosis identified.  Moderate mucosal thickening left maxillary sinus.  Cervical medullary junction, pituitary region, pineal region and orbital structures unremarkable.  Major intracranial vascular structures are patent.  IMPRESSION: Abnormal exam with appearance suggestive of acute/subacute infarct involving the left thalamus/posterior limb left internal capsule junction.  This extends to the genu of the internal capsule and there is mild mass effect upon the third ventricle.  Please see above discussion.  MRA HEAD  Findings: Asymmetric caliber of the internal carotid arteries with left larger than the right.  Etiology of this is indeterminate.  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  Fetal type origin of the left posterior cerebral artery.  Middle cerebral artery mild branch vessel irregularity.  Left vertebral artery minimally larger than the right.  Minimal narrowing distal right vertebral artery.  Nonvisualization left PICA and right AICA.  No high-grade stenosis of the basilar artery.  Mild irregularity of the posterior cerebral artery and the superior cerebellar artery branches.  Minimal bulge left internal carotid artery supraclinoid segment  appears to be origin of the vessel without discrete aneurysm noted.  IMPRESSION: Branch vessel irregularity.  This may reflect limitation of the present exam.  Intracranial atherosclerotic type changes or vasculitis not entirely excluded.  Asymmetric caliber of the internal carotid arteries, larger on the left.  Etiology indeterminate.   No pleated appearance of the left internal carotid artery as can be seen with fibromuscular dysplasia.  Critical Value/emergent results were called by telephone at the time of interpretation on 11/15/2012 at 12:45 p.m. to [place] the patient's nurse, who verbally acknowledged these results.    Original Report Authenticated By: Lacy Duverney, M.D.    Mr Brain Wo Contrast  11/15/2012  *RADIOLOGY REPORT*  Clinical Data:  27 year old female with possible seizures versus pseudo seizures for past 3-4 years. Off seizure medication for past 18 months.  3 months ago seizure episode and evaluated at outside hospital and told she had brain hemorrhage.  The patient left against medical advice and recovered within next few days.  Recent development of abnormal speech.  MRI BRAIN WITHOUT CONTRAST MRA HEAD WITHOUT CONTRAST  Technique: Multiplanar, multiecho pulse sequences of the brain and surrounding structures were obtained according to standard protocol without intravenous contrast.  Angiographic images of the head were obtained using MRA technique without contrast.  Comparison: 11/14/2012 head CT.  No earlier exams available.  MRI HEAD  Findings:  Motion degraded exam.  At the anterior lateral border of the left thalamus and adjacent medial aspect of the posterior limb of the left internal capsule there is an area of altered signal intensity demonstrating restricted motion but also seen on T2-weighted imaging with mild local mass effect upon the third ventricle.  Findings most consistent with an acute/subacute infarct.  Other causes for the above described finding (such as result of demyelinating process, infectious process, inflammatory process, result of toxic exposure, post-traumatic or tumor) are felt to be less likely considerations. However, infarct in a patient of this age is unusual and if the patient did not have the typical course for that of an infarct then this can be reevaluated on follow-up MR with contrast (it would be helpful if possible to allow time for infarct to demonstrate expected evolutionary prior to obtaining this follow-up MR).  There are no blood breakdown products in the above described findings to suggest that this is result of prior hemorrhage.  Slight altered signal intensity bone  marrow clivus may be related to underlying anemia.  High resolution T2-weighted imaging to evaluate for mesial temporal sclerosis was not able to be obtained because of patient motion. On the coronal T2 weighted standard sequences obtained, no mesial temporal sclerosis identified.  Moderate mucosal thickening left maxillary sinus.  Cervical medullary junction, pituitary region, pineal region and orbital structures unremarkable.  Major intracranial vascular structures are patent.  IMPRESSION: Abnormal exam with appearance suggestive of acute/subacute infarct involving the left thalamus/posterior limb left internal capsule junction.  This extends to the genu of the internal capsule and there is mild mass effect upon the third ventricle.  Please see above discussion.  MRA HEAD  Findings: Asymmetric caliber of the internal carotid arteries with left larger than the right.  Etiology of this is indeterminate.  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  Fetal type origin of the left posterior cerebral artery.  Middle cerebral artery mild branch vessel irregularity.  Left vertebral artery minimally larger than the right.  Minimal narrowing distal right vertebral artery.  Nonvisualization left PICA and right  AICA.  No high-grade stenosis of the basilar artery.  Mild irregularity of the posterior cerebral artery and the superior cerebellar artery branches.  Minimal bulge left internal carotid artery supraclinoid segment appears to be origin of the vessel without discrete aneurysm noted.  IMPRESSION: Branch vessel irregularity.  This may reflect limitation of the present exam.  Intracranial atherosclerotic type changes or vasculitis not entirely excluded.  Asymmetric caliber of the internal carotid arteries, larger on the left.  Etiology indeterminate.   No pleated appearance of the left internal carotid artery as can be seen with fibromuscular dysplasia.  Critical Value/emergent results were called by  telephone at the time of interpretation on 11/15/2012 at 12:45 p.m. to [place] the patient's nurse, who verbally acknowledged these results.   Original Report Authenticated By: Lacy Duverney, M.D.     Scheduled Meds:   Continuous Infusions: . sodium chloride 100 mL/hr at 11/15/12 0506     Marinda Elk  [facility] Pager [phone]. If 8PM-8AM, please contact night-coverage at [email or web address], password [facility] 11/16/2012, 10:43 AM  LOS: 2 days

## 2012-11-16 NOTE — Progress Notes (Signed)
Physical Therapy Treatment Patient Details Name: Sheri Woods MRN: [id number] DOB: 08-15-1985 Today's Date: 11/16/2012 Time: 1478-2956 PT Time Calculation (min): 30 min  PT Assessment / Plan / Recommendation Comments on Treatment Session  Pt admitted with left basal ganglia infarct.  Pt initially tired and complaint of dizziness when sitting up which subsided.  MMT 5/5 bilateral LE.  Balance testing performed and pt able to complete single leg stance for 30 seconds, but did "did not like it".  Ambulation increased along with adding cognitive tasks.  Increased time for activities. Pt appears unsteady during ambulation requiring min guard.  Acute PT to increase mobility and balance for pt saftey during functional mobility.      Follow Up Recommendations  No PT follow up. Supervision for mobility OOB     Frequency Min 4X/week   Plan Discharge plan remains appropriate    Precautions / Restrictions Precautions Precautions: None Restrictions Weight Bearing Restrictions: No   Pertinent Vitals/Pain Pt complained of pain, couldn't localize or give a score.    Mobility  Bed Mobility Bed Mobility: Supine to Sit;Rolling Left Rolling Left: 4: Min guard Supine to Sit: 4: Min assist (impulsive. v/c to wait for PT EOB) Sit to Supine: 5: Supervision Details for Bed Mobility Assistance: Min guard for saftey due to impulsivity. Transfers Transfers: Sit to Stand;Stand to Sit Sit to Stand: 5: Supervision Stand to Sit: 5: Supervision Details for Transfer Assistance: Able to initiate and perform transfers without physical assistance.  V/c and t/c for sequencing and saftey. Ambulation/Gait Ambulation/Gait Assistance: 4: Min guard Ambulation Distance (Feet): 400 Feet Assistive device: None Ambulation/Gait Assistance Details: Pt demonstrated mild inattention to left side during ambulation needing v/c not to run into wall twice. Gait Pattern: Step-through pattern;Decreased stride length;Shuffle Gait  velocity: decreased General Gait Details: Pt needing v/c for erect posture. Pt drifted to the left during ambulation Stairs: No     PT Goals Acute Rehab PT Goals PT Goal Formulation: With patient PT Goal: Sit to Stand - Progress: Progressing toward goal PT Goal: Stand to Sit - Progress: Progressing toward goal PT Goal: Stand - Progress: Progressing toward goal PT Goal: Ambulate - Progress: Progressing toward goal PT Goal: Up/Down Stairs - Progress: Progressing toward goal  Visit Information  Last PT Received On: 11/16/12 Assistance Needed: +1    Subjective Data  Subjective: Pt feeling tired and light headed.  Had not eaten breakfast.  "Didn't sleep well" Patient Stated Goal: not set   Cognition  Cognition Arousal/Alertness: Awake/alert Behavior During Therapy: Flat affect Overall Cognitive Status: Impaired/Different from baseline Area of Impairment: Memory;Problem solving;Orientation Orientation Level: Place (indicated she was in [facility]) Memory: Decreased short-term memory Problem Solving: Slow processing;Requires verbal cues;Difficulty sequencing General Comments: After ambulating down the hall, pt asked to find room.  After given room number, able to determine which direction to go.  Required increased time and v/c reminding which room number to look for.    Balance  Balance Balance Assessed: Yes Static Sitting Balance Static Sitting - Balance Support: Bilateral upper extremity supported Static Sitting - Level of Assistance: 5: Supervision Static Standing Balance - Level of Assistance: 4: Min Guard Single Leg Stance - Right Leg: 30 Single Leg Stance - Left Leg: 30 [place] - Eyes Opened: 30 [place] - Eyes Closed: 30 Pt performed ambulation with head turns, looking up/down, speed changes.  Able to complete all with increased time and v/c.  Speed changes were minimal.  End of Session PT - End of Session  Equipment Utilized During Treatment: Gait  belt Activity Tolerance: Patient tolerated treatment well Patient left: in bed;with call bell/phone within reach;with family/visitor present   GP     Payton Doughty 11/16/2012, 10:54 AM Payton Doughty, PT Student
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
## 2012-11-16 NOTE — Progress Notes (Signed)
  Echocardiogram 2D Echocardiogram has been performed.  Sheri Woods 11/16/2012, 2:48 PM

## 2012-11-16 NOTE — Evaluation (Signed)
Occupational Therapy Evaluation Patient Details Name: Sheri Woods MRN: [id number] DOB: [birth year] Today's Date: 11/16/2012 Time: 4098-1191 OT Time Calculation (min): 24 min  OT Assessment / Plan / Recommendation Clinical Impression  Pt demos decline in fuction and safety with LB ADLs and ADL mobility, decreased R UE strength following episode of AMS with Hx of head trauma and seizure activity.  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 Pt would benefit from OT services to address impairments to restore PLOF to return home safely    OT Assessment  Patient needs continued OT Services    Follow Up Recommendations  No OT follow up    Barriers to Discharge None    Equipment Recommendations  None recommended by OT    Recommendations for Other Services    Frequency  Min 2X/week    Precautions / Restrictions Precautions Precautions: None Restrictions Weight Bearing Restrictions: No   Pertinent Vitals/Pain 8/10 headache    ADL  Grooming: Performed;Wash/dry hands;Wash/dry face;Teeth care;Set up;Supervision/safety Where Assessed - Grooming: Supported standing Upper Body Bathing: Simulated;Supervision/safety;Set up Lower Body Bathing: Performed;Min guard Where Assessed - Lower Body Bathing: Supported sit to stand Upper Body Dressing: Performed;Supervision/safety;Set up Lower Body Dressing: Performed;Supervision/safety;Set up;Min guard Toilet Transfer: Research scientist (life sciences) Method: Sit to Barista: Regular height toilet;Grab bars Toileting - Clothing Manipulation and Hygiene: Performed;Supervision/safety Where Assessed - Toileting Clothing Manipulation and Hygiene: Standing Transfers/Ambulation Related to ADLs: cues fro safety and sequencing ADL Comments: Pt concerned about when she can take a shower and feeling uncomfortable if ayone else other than her husband assists her. Spoke with pt's nurse about showering when appropriate     OT Diagnosis: Generalized weakness;Acute pain  OT Problem List: Decreased strength;Impaired balance (sitting and/or standing);Pain;Decreased activity tolerance;Decreased cognition OT Treatment Interventions: Self-care/ADL training;Balance training;Therapeutic exercise;Neuromuscular education;Therapeutic activities;DME and/or AE instruction;Patient/family education   OT Goals Acute Rehab OT Goals OT Goal Formulation: With patient/family Time For Goal Achievement: 11/23/12 Potential to Achieve Goals: Good ADL Goals Pt Will Perform Grooming: with set-up;with modified independence;Standing at sink;Unsupported ADL Goal: Grooming - Progress: Goal set today Pt Will Perform Lower Body Bathing: with set-up;with modified independence ADL Goal: Lower Body Bathing - Progress: Goal set today Pt Will Perform Lower Body Dressing: with set-up;with modified independence ADL Goal: Lower Body Dressing - Progress: Goal set today Pt Will Transfer to Toilet: with modified independence ADL Goal: Toilet Transfer - Progress: Goal set today Pt Will Perform Toileting - Clothing Manipulation: with modified independence;Independently ADL Goal: Toileting - Clothing Manipulation - Progress: Goal set today Pt Will Perform Tub/Shower Transfer: with modified independence;Independently;Grab bars;with DME ADL Goal: Tub/Shower Transfer - Progress: Goal set today Arm Goals Pt Will Complete Theraband Exer: with supervision, verbal cues required/provided;3 sets;15 reps;Level 2 Theraband;to increase strength Arm Goal: Theraband Exercises - Progress: Goal set today  Visit Information  Last OT Received On: 11/16/12 Assistance Needed: +1    Subjective Data  Subjective: " I just keep having a bad headache ' Patient Stated Goal: To return home   Prior Functioning     Home Living Lives With: Spouse Available Help at Discharge: Available 24 hours/day Type of Home: House Home Access: Stairs to enter [employer] of Steps: 3 Entrance Stairs-Rails: None Home Layout: One level Bathroom Shower/Tub: Health visitor: Standard Bathroom Accessibility: Yes How Accessible: Accessible via walker Home Adaptive Equipment: None Prior Function Level of Independence: Independent Able to Take Stairs?: Yes Driving: No Vocation: Unemployed Dominant Hand: Right  Vision/Perception Vision - History Baseline Vision: No visual deficits Patient Visual Report: No change from baseline Perception Perception: Within Functional Limits   Cognition  Cognition Arousal/Alertness: Awake/alert Behavior During Therapy: Flat affect Overall Cognitive Status: Impaired/Different from baseline Area of Impairment: Memory;Problem solving;Orientation Orientation Level: Place (indicated she was in [facility]) Memory: Decreased short-term memory Problem Solving: Slow processing;Requires verbal cues;Difficulty sequencing General Comments: After ambulating down the hall, pt asked to find room.  After given room number, able to determine which direction to go.  Required increased time and v/c reminding which room number to look for.    Extremity/Trunk Assessment Right Upper Extremity Assessment RUE ROM/Strength/Tone: Deficits RUE ROM/Strength/Tone Deficits: 3/5 - 3+/5 grossly, decreased strength as compared to L UE, Pt is R handed RUE Sensation: WFL - Light Touch;WFL - Proprioception RUE Coordination: WFL - gross/fine motor Left Upper Extremity Assessment LUE ROM/Strength/Tone: WFL for tasks assessed LUE Sensation: WFL - Light Touch;WFL - Proprioception LUE Coordination: WFL - gross/fine motor     Mobility Bed Mobility Bed Mobility: Supine to Sit;Sitting - Scoot to Edge of Bed;Sit to Supine Supine to Sit: 4: Min assist Sit to Supine: 5: Supervision Details for Bed Mobility Assistance: Min guard for saftey due to impulsivity. Transfers Transfers: Sit to Stand;Stand to  Sit Sit to Stand: With upper extremity assist Stand to Sit: 5: Supervision;To bed;To toilet Details for Transfer Assistance: visual and physical cues for safety and sequencing     Exercise     Balance Balance Balance Assessed: Yes Static Sitting Balance Static Sitting - Balance Support: Bilateral upper extremity supported Static Sitting - Level of Assistance: 7: Independent Dynamic Sitting Balance Dynamic Sitting - Level of Assistance: 5: Stand by assistance Dynamic Standing Balance Dynamic Standing - Balance Support: Right upper extremity supported;During functional activity Dynamic Standing - Level of Assistance: 5: Stand by assistance   End of Session OT - End of Session Equipment Utilized During Treatment: Gait belt Activity Tolerance: Patient limited by fatigue Patient left: in bed;with call bell/phone within reach;with family/visitor present Nurse Communication: Other (comment) (appropriate time for showers and level of assist)  GO Functional Limitation: Self care Self Care Current Status (Z6109): At least 1 percent but less than 20 percent impaired, limited or restricted Self Care Goal Status (U0454): 0 percent impaired, limited or restricted   Galen Manila 11/16/2012, 11:45 AM

## 2012-11-16 NOTE — Progress Notes (Signed)
VASCULAR LAB PRELIMINARY  PRELIMINARY  PRELIMINARY  PRELIMINARY  Carotid Dopplers completed.    Preliminary report:  There is no ICA stenosis.  Vertebral artery flow is antegrade.  Treesa Mccully, RVT 11/16/2012, 3:02 PM

## 2012-11-16 NOTE — Progress Notes (Signed)
I agree with the below note. If balance impairments persist we may need to consider PT f/u. Will investigate further next session.  Ivonne Andrew PT, DPT Pager: [phone]

## 2012-11-17 ENCOUNTER — Encounter (HOSPITAL_COMMUNITY): Payer: Self-pay | Admitting: Gastroenterology

## 2012-11-17 ENCOUNTER — Encounter (HOSPITAL_COMMUNITY)
Admission: EM | Discharge status: Against Medical Advice | Payer: Self-pay | Source: Home / Self Care | Attending: Internal Medicine

## 2012-11-17 DIAGNOSIS — G9340 Encephalopathy, unspecified: Secondary | ICD-10-CM

## 2012-11-17 DIAGNOSIS — I6789 Other cerebrovascular disease: Secondary | ICD-10-CM

## 2012-11-17 DIAGNOSIS — I6350 Cerebral infarction due to unspecified occlusion or stenosis of unspecified cerebral artery: Principal | ICD-10-CM

## 2012-11-17 HISTORY — PX: TEE WITHOUT CARDIOVERSION: SHX5443

## 2012-11-17 LAB — HIV ANTIBODY (ROUTINE TESTING W REFLEX): HIV: NONREACTIVE

## 2012-11-17 LAB — RPR: RPR Ser Ql: NONREACTIVE

## 2012-11-17 SURGERY — ECHOCARDIOGRAM, TRANSESOPHAGEAL
Anesthesia: Moderate Sedation

## 2012-11-17 MED ORDER — FENTANYL CITRATE 0.05 MG/ML IJ SOLN
INTRAMUSCULAR | Status: AC
  Filled 2012-11-17: qty 4

## 2012-11-17 MED ORDER — SODIUM CHLORIDE 0.9 % IV SOLN: INTRAVENOUS | Status: DC

## 2012-11-17 MED ORDER — ASPIRIN 325 MG PO TABS
325.0000 mg | ORAL_TABLET | Freq: Every day | ORAL | Status: DC
  Administered 2012-11-17: 325 mg via ORAL
  Filled 2012-11-17 (×2): qty 1

## 2012-11-17 MED ORDER — SODIUM CHLORIDE 0.9 % IV SOLN
INTRAVENOUS | Status: DC
  Administered 2012-11-17: 500 mL via INTRAVENOUS

## 2012-11-17 MED ORDER — FENTANYL CITRATE 0.05 MG/ML IJ SOLN
INTRAMUSCULAR | Status: DC | PRN
  Administered 2012-11-17: 25 ug via INTRAVENOUS

## 2012-11-17 MED ORDER — MIDAZOLAM HCL 10 MG/2ML IJ SOLN
INTRAMUSCULAR | Status: DC | PRN
  Administered 2012-11-17: 1 mg via INTRAVENOUS
  Administered 2012-11-17: 3 mg via INTRAVENOUS

## 2012-11-17 MED ORDER — LIDOCAINE VISCOUS 2 % MT SOLN
OROMUCOSAL | Status: DC | PRN
  Administered 2012-11-17: 20 mL via OROMUCOSAL

## 2012-11-17 MED ORDER — MIDAZOLAM HCL 5 MG/ML IJ SOLN
INTRAMUSCULAR | Status: AC
  Filled 2012-11-17: qty 3

## 2012-11-17 NOTE — Progress Notes (Signed)
I have read and agree with the below treatment note and discharge from therapies. Pt appears to have returned to or near her baseline. Boyfriend reports he is there for he 24 hours a day. No further PT needs at this time.  Ivonne Andrew PT, DPT Pager: [phone]

## 2012-11-17 NOTE — Progress Notes (Signed)
Patient has been tearful and when asked "why are you crying?" Patient does not state why she is crying. Shakes her head " No" and mumbles " Nothing" Boyfriend is at bedside at this time and he is crying too. Asking " What is wrong with her. Why she is not talking to me? She is mad at me because I went home to take care of the kids and myself for four hours. I asked patient " What is wrong? Are you Okay?" Patient shakes her head "No" and mumbles " Nothing" . Boyfriend is sitting on the floor at bedside crying and saying "You are the nurse. Why won't she talk to me? I explain to him that she answered me so I don't know why she doesn't answer you. Boyfriend says " She is mad at me cause I went home for four hours" I asked them both to please not argue with each other

## 2012-11-17 NOTE — Progress Notes (Signed)
  Echocardiogram Echocardiogram Transesophageal has been performed.  Margreta Journey 11/17/2012, 4:59 PM

## 2012-11-17 NOTE — Progress Notes (Signed)
Physical Therapy Treatment Patient Details Name: Sheri Woods MRN: [id number] DOB: 1986-05-02 Today's Date: 11/17/2012 Time: 7829-5621 PT Time Calculation (min): 27 min  PT Assessment / Plan / Recommendation Comments on Treatment Session  Pt admitted with left basal ganglia infarct. Pt agreeable to activites. Pt demonstrating independence with bed mobility and basic transfers.  Able to ambulate with modified independence on level surface while performing DGI. Educated pt and boyfriend on signs and symptoms of stroke and what to do for future occurance. Educated for supervision of mobility OOB. Further Acute PT not required for this pt.     Follow Up Recommendations  No PT follow up;Supervision for mobility/OOB           Equipment Recommendations  None recommended by PT       Frequency Min 4X/week   Plan Discharge plan remains appropriate    Precautions / Restrictions Precautions Precautions: None Restrictions Weight Bearing Restrictions: No   Pertinent Vitals/Pain 8/10 headache pain on left side.  Notified nursing and pt given medication.     Mobility  Bed Mobility Bed Mobility: Supine to Sit Supine to Sit: 7: Independent Transfers Transfers: Sit to Stand;Stand to Sit Sit to Stand: 7: Independent Stand to Sit: 7: Independent Details for Transfer Assistance: pr able to perform all transfers fully Ambulation/Gait Ambulation/Gait Assistance: 6: Modified independent (Device/Increase time) Ambulation Distance (Feet): 400 Feet Assistive device: None Gait Pattern: Within Functional Limits Gait velocity: at baseline Stairs: Yes Stairs Assistance: 7: Independent Stair Management Technique: No rails Number of Stairs: 4 Wheelchair Mobility Wheelchair Mobility: No Modified Rankin (Stroke Patients Only) Pre-Morbid Rankin Score: No symptoms Modified Rankin: No symptoms     PT Goals Acute Rehab PT Goals PT Goal: Sit to Stand - Progress: Met PT Goal: Stand to Sit - Progress:  Met PT Goal: Stand - Progress: Met PT Goal: Ambulate - Progress: Met PT Goal: Up/Down Stairs - Progress: Met  Visit Information  Last PT Received On: 11/17/12 Assistance Needed: +1    Subjective Data  Subjective: Feeling good today except for headache Patient Stated Goal: go home   Cognition  Cognition Arousal/Alertness: Awake/alert Behavior During Therapy: Flat affect Overall Cognitive Status: Impaired/Different from baseline (minimal) Area of Impairment: Memory Memory: Decreased short-term memory Problem Solving: Slow processing General Comments: Pt unable to remember room number during ambulation.  When given number, able to find room easily.    Balance  Balance Balance Assessed: Yes Static Sitting Balance Static Sitting - Level of Assistance: 7: Independent Dynamic Sitting Balance Dynamic Sitting - Level of Assistance: 7: Independent Dynamic Standing Balance Dynamic Standing - Level of Assistance: 7: Independent Standardized Balance Assessment Standardized Balance Assessment: Dynamic Gait Index Dynamic Gait Index Level Surface: Normal Change in Gait Speed: Mild Impairment Gait with Horizontal Head Turns: Mild Impairment Gait with Vertical Head Turns: Normal Gait and Pivot Turn: Mild Impairment Step Over Obstacle: Mild Impairment Step Around Obstacles: Normal Steps: Normal Total Score: 20  End of Session PT - End of Session Equipment Utilized During Treatment: Gait belt Activity Tolerance: Patient tolerated treatment well Patient left: in chair;with family/visitor present Nurse Communication: Mobility status   GP     Payton Doughty 11/17/2012, 2:54 PM Payton Doughty, PT Student

## 2012-11-17 NOTE — Progress Notes (Signed)
Stroke Team Progress Note  HISTORY Sheri Woods is a 27 y.o. 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 She was previously treated with antiepileptic medications, but has not been on one for 1.5 years. She had an episode 3 months ago, the details of which are not clear because the husband was not with her at that time, that prompted her to go to [facility] where she was told that she had a brain hemorrhage. She left AMA prior to being fully evaluated. And recovered over the next few days. Of note, she is under a lot of stress and her and her husband had a big fight yesterday.   She came to the ED 11/14/2012 "talking out of her head". She had 3 separate incidents. She was confused most of the day. Hx migraines and sz vs pseudo sz.   Patient was not a TPA candidate secondary to delay in arrival.  She was admitted for further evaluation and treatment.  SUBJECTIVE Patient in room with lights out, asleep. Someone else in roll out bed, also asleep.  OBJECTIVE Most recent Vital Signs: Filed Vitals:   11/16/12 2200 11/17/12 0200 11/17/12 0600 11/17/12 0941  BP: 147/89 138/89 109/66 116/85  Pulse: 74 57 60 58  Temp: 98.1 F (36.7 C) 98.1 F (36.7 C) 98.3 F (36.8 C) 98.1 F (36.7 C)  TempSrc: Oral Oral Oral   Resp: 18 18 18 16   Height:      Weight:      SpO2: 100% 100% 100% 92%   CBG (last 3)   Recent Labs  11/14/12 2033  GLUCAP 106*   IV Fluid Intake:   . sodium chloride 100 mL/hr at 11/16/12 1611   MEDICATIONS  . aspirin  325 mg Oral Daily   PRN:  acetaminophen, acetaminophen, alum & mag hydroxide-simeth, ondansetron (ZOFRAN) IV, ondansetron, oxyCODONE, senna-docusate, zolpidem  Diet:  NPO Activity:  Bedrest, Up with assistance DVT Prophylaxis:  SCDs   CLINICALLY SIGNIFICANT STUDIES Basic Metabolic Panel:   Recent Labs Lab 11/14/12 2040 11/15/12 0500  NA 140 138  K 3.6 3.6  CL 104 104  CO2 25 23  GLUCOSE 92 83  BUN 14 14   CREATININE 0.72 0.70  CALCIUM 9.9 9.0   Liver Function Tests: No results found for this basename: AST, ALT, ALKPHOS, BILITOT, PROT, ALBUMIN,  in the last 168 hours CBC:   Recent Labs Lab 11/14/12 2040 11/15/12 0500  WBC 10.8* 8.4  HGB 12.2 10.7*  HCT 35.7* 31.3*  MCV 79.2 79.4  PLT 362 321   Coagulation: No results found for this basename: LABPROT, INR,  in the last 168 hours Cardiac Enzymes: No results found for this basename: CKTOTAL, CKMB, CKMBINDEX, TROPONINI,  in the last 168 hours Urinalysis:   Recent Labs Lab 11/15/12 1332  COLORURINE YELLOW  LABSPEC 1.031*  PHURINE 6.0  GLUCOSEU NEGATIVE  HGBUR SMALL*  BILIRUBINUR NEGATIVE  KETONESUR 40*  PROTEINUR NEGATIVE  UROBILINOGEN 1.0  NITRITE NEGATIVE  LEUKOCYTESUR NEGATIVE   Lipid Panel    Component Value Date/Time   CHOL 136 11/16/2012 0539   TRIG 92 11/16/2012 0539   HDL 38* 11/16/2012 0539   CHOLHDL 3.6 11/16/2012 0539   VLDL 18 11/16/2012 0539   LDLCALC 80 11/16/2012 0539   HgbA1C  Lab Results  Component Value Date   HGBA1C 5.8* 11/16/2012    Urine Drug Screen:     Component Value Date/Time   LABOPIA NONE DETECTED 11/14/2012 2157   COCAINSCRNUR  NONE DETECTED 11/14/2012 2157   LABBENZ NONE DETECTED 11/14/2012 2157   AMPHETMU NONE DETECTED 11/14/2012 2157   THCU POSITIVE* 11/14/2012 2157   LABBARB NONE DETECTED 11/14/2012 2157    Alcohol Level:   Recent Labs Lab 11/14/12 2040  ETH <11   Hypercoagulable Workup Normal - C3, C4, ESR, Anti III, ANA, homocysteine, RPR, HIV, Prot C total & Protein S activity and total, lupus anticoagulant, cardiolipin antibody, Pending - CH50 Protein C activity - 189 (high)   CT of the brain  11/15/2012  Nonspecific hypodensity left basal ganglia, favored to reflect sequelae of prior infarct/hemorrhage.    MRI of the brain  11/15/2012   Abnormal exam with appearance suggestive of acute/subacute infarct involving the left thalamus/posterior limb left internal capsule junction.  This extends  to the genu of the internal capsule and there is mild mass effect upon the third ventricle.    MRA of the brain  11/15/2012   Branch vessel irregularity.  This may reflect limitation of the present exam.  Intracranial atherosclerotic type changes or vasculitis not entirely excluded.  Asymmetric caliber of the internal carotid arteries, larger on the left.  Etiology indeterminate.   No pleated appearance of the left internal carotid artery as can be seen with fibromuscular dysplasia.   2D Echocardiogram  EF 60-65% with no source of embolus.   TEE    Carotid Doppler  No evidence of hemodynamically significant internal carotid artery stenosis. Vertebral artery flow is antegrade.   CXR  11/15/2012  No active disease.  Mild elevation of the right hemidiaphragm.     EEG This is a normal EEG recording during wakefulness and during sleep.    Therapy Recommendations none  Physical Exam tearful female, appears tired. HRR. resp regular. Neurologic Exam  Alert and oriented x 1. Speech slurred at times. Can follow 1, 2, and 3 step commands. Can name, repeat. Speech hesitant at times with decreased fluency. Face symmetric; tongue midline. EOMI. Visual fields full. No drift in arm or legs. Strength 5/5. sensation intact. No neglect.  ASSESSMENT [name] is a 27 y.o. female presenting with acute confusion. Imaging confirms a  acute/subacute infarct left thalamus/posterior limb left internal capsule. Infarcts felt to be  embolic secondary to unknown etiology.  On no antiplatelets prior to admission. Now on no antiplatelets for secondary stroke prevention. Patient with resultant speech hesitancy and decreased fluency. Work up underway.   UDS positive for THC, which is now associated with stroke  Hx cocaine use within past 1 week, also likely cause of stroke from vasospasm with it's use  Hospital day # 3  TREATMENT/PLAN  Add aspirin 325 mg orally every day for secondary stroke prevention.  F/u CH50  (rest of hypercoagulable studies unremarkable) TEE to look for embolic source. Arranged with [facility]. If positive for PFO (patent foramen ovale), check bilateral lower extremity venous dopplers to rule out DVT as possible source of stroke. Ok for discharge after TEE from stroke standpoint If TEE unrevealing, please schedule an outpatient TCD bubble study with emboli monitoring with Dr. Pearlean Brownie in 1 month to further evaluate for possible PFO. Have patient call for appointment. (I have added to discharge instruction sheet). Patient has a 10-15% risk of having another stroke over the next year, the highest risk is within 2 weeks of the most recent stroke/TIA (risk of having a stroke following a stroke or TIA is the same). Stop using cocaine Follow up with Dr. Pearlean Brownie, Stroke Clinic, in 2 months.  Annie Main, MSN, RN, ANVP-BC, ANP-BC, [facility] Pager: [phone] 11/17/2012 11:43 AM  I have personally  , evaluated imaging results, and formulated the assessment and plan of care. I agree with the above.  Delia Heady, MD

## 2012-11-17 NOTE — Progress Notes (Addendum)
[facility] PROGRESS NOTE  Assessment/Plan: Encephalopathy acute - ct head 5.3.2014: hypodensity left basal ganglia - TEE if +, doppler carotid: no ICA stenosis. - start ASA - neurology consulted. - MRI positive for a CVA, ANA, hypercoagulable panel, RPR, HIV.  Pseudoseizures - none here, on telemetry. - check a prolactin level with in 2- minutes if she has seiZures. - EEG normal EEG recording wakefulness and during sleep.   Migraine headache - PRN medication, avoid narcotics.    Code Status: full Family Communication: boyfriend  Disposition Plan: home in 1-2 days   Consultants:  neuro  Procedures:  MRI  EEG  echo  Antibiotics:  None  HPI/Subjective: Boyfriend  And patient. Admitted to use drugs  Objective: Filed Vitals:   11/16/12 2200 11/17/12 0200 11/17/12 0600 11/17/12 0941  BP: 147/89 138/89 109/66 116/85  Pulse: 74 57 60 58  Temp: 98.1 F (36.7 C) 98.1 F (36.7 C) 98.3 F (36.8 C) 98.1 F (36.7 C)  TempSrc: Oral Oral Oral   Resp: 18 18 18 16   Height:      Weight:      SpO2: 100% 100% 100% 92%   No intake or output data in the 24 hours ending 11/17/12 1004 Filed Weights   11/15/12 0507  Weight: 49 kg (108 lb 0.4 oz)    Exam:  General: Alert, awake, oriented x3, in no acute distress.  HEENT: No bruits, no goiter.  Heart: Regular rate and rhythm, without murmurs, rubs, gallops.  Lungs: Good air movement, bilateral air movement.  Abdomen: Soft, nontender, nondistended, positive bowel sounds.  Neuro: Grossly intact, nonfocal.   Data Reviewed: Basic Metabolic Panel:  Recent Labs Lab 11/14/12 2040 11/15/12 0500  NA 140 138  K 3.6 3.6  CL 104 104  CO2 25 23  GLUCOSE 92 83  BUN 14 14  CREATININE 0.72 0.70  CALCIUM 9.9 9.0   Liver Function Tests: No results found for this basename: AST, ALT, ALKPHOS, BILITOT, PROT, ALBUMIN,  in the last 168 hours No results found for this basename: LIPASE, AMYLASE,  in the last 168  hours No results found for this basename: AMMONIA,  in the last 168 hours CBC:  Recent Labs Lab 11/14/12 2040 11/15/12 0500  WBC 10.8* 8.4  HGB 12.2 10.7*  HCT 35.7* 31.3*  MCV 79.2 79.4  PLT 362 321   Cardiac Enzymes: No results found for this basename: CKTOTAL, CKMB, CKMBINDEX, TROPONINI,  in the last 168 hours BNP (last 3 results) No results found for this basename: PROBNP,  in the last 8760 hours CBG:  Recent Labs Lab 11/14/12 2033  GLUCAP 106*    No results found for this or any previous visit (from the past 240 hour(s)).   Studies: Dg Chest 2 View  11/15/2012  *RADIOLOGY REPORT*  Clinical Data: Stroke  CHEST - 2 VIEW  Comparison: None.  Findings: Cardiomediastinal silhouette is unremarkable.  Mild elevation of the right hemidiaphragm.  No acute infiltrate or pleural effusion.  No pulmonary edema.  IMPRESSION: No active disease.  Mild elevation of the right hemidiaphragm.   Original Report Authenticated By: [name], M.D.    Mr Maxine Glenn Head Wo Contrast  11/15/2012  *RADIOLOGY REPORT*  Clinical Data:  27 year old female with possible seizures versus pseudo seizures for past 3-4 years. Off seizure medication for past 18 months.  3 months ago seizure episode and evaluated at outside hospital and told she had brain hemorrhage.  The patient left against medical advice and recovered  within next few days.  Recent development of abnormal speech.  MRI BRAIN WITHOUT CONTRAST MRA HEAD WITHOUT CONTRAST  Technique: Multiplanar, multiecho pulse sequences of the brain and surrounding structures were obtained according to standard protocol without intravenous contrast.  Angiographic images of the head were obtained using MRA technique without contrast.  Comparison: 11/14/2012 head CT.  No earlier exams available.  MRI HEAD  Findings:  Motion degraded exam.  At the anterior lateral border of the left thalamus and adjacent medial aspect of the posterior limb of the left internal capsule there is an  area of altered signal intensity demonstrating restricted motion but also seen on T2-weighted imaging with mild local mass effect upon the third ventricle.  Findings most consistent with an acute/subacute infarct.  Other causes for the above described finding (such as result of demyelinating process, infectious process, inflammatory process, result of toxic exposure, post-traumatic or tumor) are felt to be less likely considerations. However, infarct in a patient of this age is unusual and if the patient did not have the typical course for that of an infarct then this can be reevaluated on follow-up MR with contrast (it would be helpful if possible to allow time for infarct to demonstrate expected evolutionary prior to obtaining this follow-up MR).  There are no blood breakdown products in the above described findings to suggest that this is result of prior hemorrhage.  Slight altered signal intensity bone marrow clivus may be related to underlying anemia.  High resolution T2-weighted imaging to evaluate for mesial temporal sclerosis was not able to be obtained because of patient motion. On the coronal T2 weighted standard sequences obtained, no mesial temporal sclerosis identified.  Moderate mucosal thickening left maxillary sinus.  Cervical medullary junction, pituitary region, pineal region and orbital structures unremarkable.  Major intracranial vascular structures are patent.  IMPRESSION: Abnormal exam with appearance suggestive of acute/subacute infarct involving the left thalamus/posterior limb left internal capsule junction.  This extends to the genu of the internal capsule and there is mild mass effect upon the third ventricle.  Please see above discussion.  MRA HEAD  Findings: Asymmetric caliber of the internal carotid arteries with left larger than the right.  Etiology of this is indeterminate.  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  Fetal type origin of the left posterior cerebral  artery.  Middle cerebral artery mild branch vessel irregularity.  Left vertebral artery minimally larger than the right.  Minimal narrowing distal right vertebral artery.  Nonvisualization left PICA and right AICA.  No high-grade stenosis of the basilar artery.  Mild irregularity of the posterior cerebral artery and the superior cerebellar artery branches.  Minimal bulge left internal carotid artery supraclinoid segment appears to be origin of the vessel without discrete aneurysm noted.  IMPRESSION: Branch vessel irregularity.  This may reflect limitation of the present exam.  Intracranial atherosclerotic type changes or vasculitis not entirely excluded.  Asymmetric caliber of the internal carotid arteries, larger on the left.  Etiology indeterminate.   No pleated appearance of the left internal carotid artery as can be seen with fibromuscular dysplasia.  Critical Value/emergent results were called by telephone at the time of interpretation on 11/15/2012 at 12:45 p.m. to [place] the patient's nurse, who verbally acknowledged these results.   Original Report Authenticated By: Lacy Duverney, M.D.    Mr Brain Wo Contrast  11/15/2012  *RADIOLOGY REPORT*  Clinical Data:  27 year old female with possible seizures versus pseudo seizures for past 3-4 years. Off  seizure medication for past 18 months.  3 months ago seizure episode and evaluated at outside hospital and told she had brain hemorrhage.  The patient left against medical advice and recovered within next few days.  Recent development of abnormal speech.  MRI BRAIN WITHOUT CONTRAST MRA HEAD WITHOUT CONTRAST  Technique: Multiplanar, multiecho pulse sequences of the brain and surrounding structures were obtained according to standard protocol without intravenous contrast.  Angiographic images of the head were obtained using MRA technique without contrast.  Comparison: 11/14/2012 head CT.  No earlier exams available.  MRI HEAD  Findings:  Motion degraded exam.  At the  anterior lateral border of the left thalamus and adjacent medial aspect of the posterior limb of the left internal capsule there is an area of altered signal intensity demonstrating restricted motion but also seen on T2-weighted imaging with mild local mass effect upon the third ventricle.  Findings most consistent with an acute/subacute infarct.  Other causes for the above described finding (such as result of demyelinating process, infectious process, inflammatory process, result of toxic exposure, post-traumatic or tumor) are felt to be less likely considerations. However, infarct in a patient of this age is unusual and if the patient did not have the typical course for that of an infarct then this can be reevaluated on follow-up MR with contrast (it would be helpful if possible to allow time for infarct to demonstrate expected evolutionary prior to obtaining this follow-up MR).  There are no blood breakdown products in the above described findings to suggest that this is result of prior hemorrhage.  Slight altered signal intensity bone marrow clivus may be related to underlying anemia.  High resolution T2-weighted imaging to evaluate for mesial temporal sclerosis was not able to be obtained because of patient motion. On the coronal T2 weighted standard sequences obtained, no mesial temporal sclerosis identified.  Moderate mucosal thickening left maxillary sinus.  Cervical medullary junction, pituitary region, pineal region and orbital structures unremarkable.  Major intracranial vascular structures are patent.  IMPRESSION: Abnormal exam with appearance suggestive of acute/subacute infarct involving the left thalamus/posterior limb left internal capsule junction.  This extends to the genu of the internal capsule and there is mild mass effect upon the third ventricle.  Please see above discussion.  MRA HEAD  Findings: Asymmetric caliber of the internal carotid arteries with left larger than the right.  Etiology of  this is indeterminate.  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  Fetal type origin of the left posterior cerebral artery.  Middle cerebral artery mild branch vessel irregularity.  Left vertebral artery minimally larger than the right.  Minimal narrowing distal right vertebral artery.  Nonvisualization left PICA and right AICA.  No high-grade stenosis of the basilar artery.  Mild irregularity of the posterior cerebral artery and the superior cerebellar artery branches.  Minimal bulge left internal carotid artery supraclinoid segment appears to be origin of the vessel without discrete aneurysm noted.  IMPRESSION: Branch vessel irregularity.  This may reflect limitation of the present exam.  Intracranial atherosclerotic type changes or vasculitis not entirely excluded.  Asymmetric caliber of the internal carotid arteries, larger on the left.  Etiology indeterminate.   No pleated appearance of the left internal carotid artery as can be seen with fibromuscular dysplasia.  Critical Value/emergent results were called by telephone at the time of interpretation on 11/15/2012 at 12:45 p.m. to [place] the patient's nurse, who verbally acknowledged these results.   Original Report Authenticated By: Lacy Duverney,  M.D.     Scheduled Meds: . aspirin  325 mg Oral Daily   Continuous Infusions: . sodium chloride 100 mL/hr at 11/16/12 1611     FELIZ Rosine Beat  [facility] Pager [phone]. If 8PM-8AM, please contact night-coverage at [email or web address], password [email or web address] 11/17/2012, 10:04 AM  LOS: 3 days

## 2012-11-17 NOTE — H&P (View-Only) (Signed)
[facility] PROGRESS NOTE  Assessment/Plan: Encephalopathy acute - ct head 5.3.2014: hypodensity left basal ganglia - TEE if +, doppler carotid: no ICA stenosis. - start ASA - neurology consulted. - MRI positive for a CVA, ANA, hypercoagulable panel, RPR, HIV.  Pseudoseizures - none here, on telemetry. - check a prolactin level with in 2- minutes if she has seiZures. - EEG normal EEG recording wakefulness and during sleep.   Migraine headache - PRN medication, avoid narcotics.    Code Status: full Family Communication: boyfriend  Disposition Plan: home in 1-2 days   Consultants:  neuro  Procedures:  MRI  EEG  echo  Antibiotics:  None  HPI/Subjective: Boyfriend  And patient. Admitted to use drugs  Objective: Filed Vitals:   11/16/12 2200 11/17/12 0200 11/17/12 0600 11/17/12 0941  BP: 147/89 138/89 109/66 116/85  Pulse: 74 57 60 58  Temp: 98.1 F (36.7 C) 98.1 F (36.7 C) 98.3 F (36.8 C) 98.1 F (36.7 C)  TempSrc: Oral Oral Oral   Resp: 18 18 18 16  Height:      Weight:      SpO2: 100% 100% 100% 92%   No intake or output data in the 24 hours ending 11/17/12 1004 Filed Weights   11/15/12 0507  Weight: 49 kg (108 lb 0.4 oz)    Exam:  General: Alert, awake, oriented x3, in no acute distress.  HEENT: No bruits, no goiter.  Heart: Regular rate and rhythm, without murmurs, rubs, gallops.  Lungs: Good air movement, bilateral air movement.  Abdomen: Soft, nontender, nondistended, positive bowel sounds.  Neuro: Grossly intact, nonfocal.   Data Reviewed: Basic Metabolic Panel:  Recent Labs Lab 11/14/12 2040 11/15/12 0500  NA 140 138  K 3.6 3.6  CL 104 104  CO2 25 23  GLUCOSE 92 83  BUN 14 14  CREATININE 0.72 0.70  CALCIUM 9.9 9.0   Liver Function Tests: No results found for this basename: AST, ALT, ALKPHOS, BILITOT, PROT, ALBUMIN,  in the last 168 hours No results found for this basename: LIPASE, AMYLASE,  in the last 168  hours No results found for this basename: AMMONIA,  in the last 168 hours CBC:  Recent Labs Lab 11/14/12 2040 11/15/12 0500  WBC 10.8* 8.4  HGB 12.2 10.7*  HCT 35.7* 31.3*  MCV 79.2 79.4  PLT 362 321   Cardiac Enzymes: No results found for this basename: CKTOTAL, CKMB, CKMBINDEX, TROPONINI,  in the last 168 hours BNP (last 3 results) No results found for this basename: PROBNP,  in the last 8760 hours CBG:  Recent Labs Lab 11/14/12 2033  GLUCAP 106*    No results found for this or any previous visit (from the past 240 hour(s)).   Studies: Dg Chest 2 View  11/15/2012  *RADIOLOGY REPORT*  Clinical Data: Stroke  CHEST - 2 VIEW  Comparison: None.  Findings: Cardiomediastinal silhouette is unremarkable.  Mild elevation of the right hemidiaphragm.  No acute infiltrate or pleural effusion.  No pulmonary edema.  IMPRESSION: No active disease.  Mild elevation of the right hemidiaphragm.   Original Report Authenticated By: Liviu Pop, M.D.    Mr Mra Head Wo Contrast  11/15/2012  *RADIOLOGY REPORT*  Clinical Data:  27 year old female with possible seizures versus pseudo seizures for past 3-4 years. Off seizure medication for past 18 months.  3 months ago seizure episode and evaluated at outside hospital and told she had brain hemorrhage.  The patient left against medical advice and recovered   within next few days.  Recent development of abnormal speech.  MRI BRAIN WITHOUT CONTRAST MRA HEAD WITHOUT CONTRAST  Technique: Multiplanar, multiecho pulse sequences of the brain and surrounding structures were obtained according to standard protocol without intravenous contrast.  Angiographic images of the head were obtained using MRA technique without contrast.  Comparison: 11/14/2012 head CT.  No earlier exams available.  MRI HEAD  Findings:  Motion degraded exam.  At the anterior lateral border of the left thalamus and adjacent medial aspect of the posterior limb of the left internal capsule there is an  area of altered signal intensity demonstrating restricted motion but also seen on T2-weighted imaging with mild local mass effect upon the third ventricle.  Findings most consistent with an acute/subacute infarct.  Other causes for the above described finding (such as result of demyelinating process, infectious process, inflammatory process, result of toxic exposure, post-traumatic or tumor) are felt to be less likely considerations. However, infarct in a patient of this age is unusual and if the patient did not have the typical course for that of an infarct then this can be reevaluated on follow-up MR with contrast (it would be helpful if possible to allow time for infarct to demonstrate expected evolutionary prior to obtaining this follow-up MR).  There are no blood breakdown products in the above described findings to suggest that this is result of prior hemorrhage.  Slight altered signal intensity bone marrow clivus may be related to underlying anemia.  High resolution T2-weighted imaging to evaluate for mesial temporal sclerosis was not able to be obtained because of patient motion. On the coronal T2 weighted standard sequences obtained, no mesial temporal sclerosis identified.  Moderate mucosal thickening left maxillary sinus.  Cervical medullary junction, pituitary region, pineal region and orbital structures unremarkable.  Major intracranial vascular structures are patent.  IMPRESSION: Abnormal exam with appearance suggestive of acute/subacute infarct involving the left thalamus/posterior limb left internal capsule junction.  This extends to the genu of the internal capsule and there is mild mass effect upon the third ventricle.  Please see above discussion.  MRA HEAD  Findings: Asymmetric caliber of the internal carotid arteries with left larger than the right.  Etiology of this is indeterminate.  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  Fetal type origin of the left posterior cerebral  artery.  Middle cerebral artery mild branch vessel irregularity.  Left vertebral artery minimally larger than the right.  Minimal narrowing distal right vertebral artery.  Nonvisualization left PICA and right AICA.  No high-grade stenosis of the basilar artery.  Mild irregularity of the posterior cerebral artery and the superior cerebellar artery branches.  Minimal bulge left internal carotid artery supraclinoid segment appears to be origin of the vessel without discrete aneurysm noted.  IMPRESSION: Branch vessel irregularity.  This may reflect limitation of the present exam.  Intracranial atherosclerotic type changes or vasculitis not entirely excluded.  Asymmetric caliber of the internal carotid arteries, larger on the left.  Etiology indeterminate.   No pleated appearance of the left internal carotid artery as can be seen with fibromuscular dysplasia.  Critical Value/emergent results were called by telephone at the time of interpretation on 11/15/2012 at 12:45 p.m. to [place] the patient's nurse, who verbally acknowledged these results.   Original Report Authenticated By: Steven Olson, M.D.    Mr Brain Wo Contrast  11/15/2012  *RADIOLOGY REPORT*  Clinical Data:  27 year old female with possible seizures versus pseudo seizures for past 3-4 years. Off   seizure medication for past 18 months.  3 months ago seizure episode and evaluated at outside hospital and told she had brain hemorrhage.  The patient left against medical advice and recovered within next few days.  Recent development of abnormal speech.  MRI BRAIN WITHOUT CONTRAST MRA HEAD WITHOUT CONTRAST  Technique: Multiplanar, multiecho pulse sequences of the brain and surrounding structures were obtained according to standard protocol without intravenous contrast.  Angiographic images of the head were obtained using MRA technique without contrast.  Comparison: 11/14/2012 head CT.  No earlier exams available.  MRI HEAD  Findings:  Motion degraded exam.  At the  anterior lateral border of the left thalamus and adjacent medial aspect of the posterior limb of the left internal capsule there is an area of altered signal intensity demonstrating restricted motion but also seen on T2-weighted imaging with mild local mass effect upon the third ventricle.  Findings most consistent with an acute/subacute infarct.  Other causes for the above described finding (such as result of demyelinating process, infectious process, inflammatory process, result of toxic exposure, post-traumatic or tumor) are felt to be less likely considerations. However, infarct in a patient of this age is unusual and if the patient did not have the typical course for that of an infarct then this can be reevaluated on follow-up MR with contrast (it would be helpful if possible to allow time for infarct to demonstrate expected evolutionary prior to obtaining this follow-up MR).  There are no blood breakdown products in the above described findings to suggest that this is result of prior hemorrhage.  Slight altered signal intensity bone marrow clivus may be related to underlying anemia.  High resolution T2-weighted imaging to evaluate for mesial temporal sclerosis was not able to be obtained because of patient motion. On the coronal T2 weighted standard sequences obtained, no mesial temporal sclerosis identified.  Moderate mucosal thickening left maxillary sinus.  Cervical medullary junction, pituitary region, pineal region and orbital structures unremarkable.  Major intracranial vascular structures are patent.  IMPRESSION: Abnormal exam with appearance suggestive of acute/subacute infarct involving the left thalamus/posterior limb left internal capsule junction.  This extends to the genu of the internal capsule and there is mild mass effect upon the third ventricle.  Please see above discussion.  MRA HEAD  Findings: Asymmetric caliber of the internal carotid arteries with left larger than the right.  Etiology of  this is indeterminate.  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  Fetal type origin of the left posterior cerebral artery.  Middle cerebral artery mild branch vessel irregularity.  Left vertebral artery minimally larger than the right.  Minimal narrowing distal right vertebral artery.  Nonvisualization left PICA and right AICA.  No high-grade stenosis of the basilar artery.  Mild irregularity of the posterior cerebral artery and the superior cerebellar artery branches.  Minimal bulge left internal carotid artery supraclinoid segment appears to be origin of the vessel without discrete aneurysm noted.  IMPRESSION: Branch vessel irregularity.  This may reflect limitation of the present exam.  Intracranial atherosclerotic type changes or vasculitis not entirely excluded.  Asymmetric caliber of the internal carotid arteries, larger on the left.  Etiology indeterminate.   No pleated appearance of the left internal carotid artery as can be seen with fibromuscular dysplasia.  Critical Value/emergent results were called by telephone at the time of interpretation on 11/15/2012 at 12:45 p.m. to [place] the patient's nurse, who verbally acknowledged these results.   Original Report Authenticated By: Steven Olson,   M.D.     Scheduled Meds: . aspirin  325 mg Oral Daily   Continuous Infusions: . sodium chloride 100 mL/hr at 11/16/12 1611     FELIZ ORTIZ, Miryam Mcelhinney  [facility] Pager [phone]. If 8PM-8AM, please contact night-coverage at [email or web address], password [email or web address] 11/17/2012, 10:04 AM  LOS: 3 days        

## 2012-11-17 NOTE — CV Procedure (Signed)
    TRANSESOPHAGEAL ECHOCARDIOGRAM   NAME:  Denina Rieger   MRN: [id number] DOB:  02/11/1986   ADMIT DATE: 11/14/2012  INDICATIONS: Stroke   PROCEDURE:   Informed consent was obtained prior to the procedure. The risks, benefits and alternatives for the procedure were discussed and the patient comprehended these risks.  Risks include, but are not limited to, cough, sore throat, vomiting, nausea, somnolence, esophageal and stomach trauma or perforation, bleeding, low blood pressure, aspiration, pneumonia, infection, trauma to the teeth and death.    After a procedural time-out, the patient was given 4 mg versed and 25 mcg fentanyl for moderate sedation.  The oropharynx was anesthetized 10 cc of topical 1% viscous lidocaine.  The transesophageal probe was inserted in the esophagus and stomach without difficulty and multiple views were obtained.    COMPLICATIONS:    There were no immediate complications.  FINDINGS:  LEFT VENTRICLE: EF = 60%. No regional wall motion abnormalities.  RIGHT VENTRICLE: Normal size and function.   LEFT ATRIUM: Normal  LEFT ATRIAL APPENDAGE: No thrombus.   RIGHT ATRIUM: Normal  AORTIC VALVE:  [place]. No AS/AI  MITRAL VALVE:    Normal. No MR.  TRICUSPID VALVE: Normal. Trivial TR  PULMONIC VALVE: Not well visualized  INTERATRIAL SEPTUM: Small PFO with left to right shunting at rest. Bubble study with very mild right to left shunting with valsalva.  PERICARDIUM: No effusion  DESCENDING AORTA: Trivial plaque.

## 2012-11-17 NOTE — Procedures (Signed)
EEG NUMBER:  [id number].  REFERRING PHYSICIAN:  Della Goo, M.D.  INDICATION FOR STUDY:  A 27 year old with altered mental status as well as acute left thalamic stroke.  Study is being performed to rule out possible new-onset seizure disorder.  DESCRIPTION:  This is a routine EEG recording performed during wakefulness and during sleep.  Majority of the recording occurred during drowsiness, which consisted of low-to-moderate amplitude, diffuse, continuous, irregular delta and theta activity.  During periods of wakefulness, 9 Hz symmetrical alpha rhythm was recorded from the posterior head regions and was symmetrical.  During stage II of sleep, symmetrical sleep spindles and K complexes were recorded.  Photic stimulation was not performed.  Hyperventilation was not performed.  No epileptiform discharges were recorded.  There were no areas of abnormal slowing.  INTERPRETATION:  This is a normal EEG recording during wakefulness and during sleep.     Noel Christmas, MD    [id number] D:  11/16/2012 14:41:44  T:  11/17/2012 02:10:08  Job #:  [id number]

## 2012-11-17 NOTE — Interval H&P Note (Signed)
History and Physical Interval Note:  11/17/2012 3:50 PM  Sheri Woods  has presented today for surgery, with the diagnosis of stroke  The various methods of treatment have been discussed with the patient and family. After consideration of risks, benefits and other options for treatment, the patient has consented to  Procedure(s): TRANSESOPHAGEAL ECHOCARDIOGRAM (TEE) (N/A) as a surgical intervention .  The patient's history has been reviewed, patient examined, no change in status, stable for surgery.  I have reviewed the patient's chart and labs.  Questions were answered to the patient's satisfaction.     Daniel Bensimhon
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
## 2012-11-18 ENCOUNTER — Encounter (HOSPITAL_COMMUNITY): Payer: Self-pay | Admitting: Internal Medicine

## 2012-11-18 LAB — COMPLEMENT, TOTAL: Compl, Total (CH50): >60 U/mL — ABNORMAL HIGH (ref 31–60)

## 2012-11-18 MED ORDER — LORAZEPAM 2 MG/ML IJ SOLN
0.2500 mg | INTRAMUSCULAR | Status: DC | PRN
  Administered 2012-11-18: 0.25 mg via INTRAVENOUS
  Filled 2012-11-18: qty 1

## 2012-11-18 NOTE — Care Management Note (Signed)
    Page 1 of 1   11/18/2012     8:40:48 AM   CARE MANAGEMENT NOTE 11/18/2012  Patient:  Sheri Woods, Sheri Woods   Account Number:  [id number]  Date Initiated:  11/18/2012  Documentation initiated by:  Jacquelynn Cree  Subjective/Objective Assessment:   admitted with CVA     Action/Plan:   PT/OT evals-no follow up recommended   Anticipated DC Date:     Anticipated DC Plan:    In-house referral  Clinical Social Worker      DC Planning Services  CM consult      Choice offered to / List presented to:             Status of service:  Completed, signed off Medicare Important Message given?   (If response is "NO", the following Medicare IM given date fields will be blank) Date Medicare IM given:   Date Additional Medicare IM given:    Discharge Disposition:  HOME/SELF CARE  Per UR Regulation:  Reviewed for med. necessity/level of care/duration of stay  If discussed at Long Length of Stay Meetings, dates discussed:    Comments:
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
## 2012-11-18 NOTE — Progress Notes (Signed)
Patient left AMA. NP Maren Reamer made aware. Patient refused to sign AMA form
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
## 2012-11-19 NOTE — Clinical Social Work Note (Signed)
Late Entry  Pt discharged prior to assessment. Per Nursing, pt left AMA. CSW signing off.   Dede Query, MSW, LCSW [phone]
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
## 2012-11-30 ENCOUNTER — Emergency Department: Payer: Self-pay | Admitting: Emergency Medicine

## 2014-02-07 ENCOUNTER — Ambulatory Visit: Payer: Self-pay | Admitting: Physician Assistant

## 2014-06-29 ENCOUNTER — Emergency Department: Payer: Self-pay | Admitting: Student

## 2015-03-28 ENCOUNTER — Emergency Department: Payer: Medicaid Other

## 2015-03-28 ENCOUNTER — Emergency Department
Admission: EM | Admit: 2015-03-28 | Discharge: 2015-03-28 | Disposition: A | Discharge status: Home / Self Care | Payer: Medicaid Other | Attending: Emergency Medicine | Admitting: Emergency Medicine

## 2015-03-28 DIAGNOSIS — M25562 Pain in left knee: Secondary | ICD-10-CM

## 2015-03-28 DIAGNOSIS — Y9389 Activity, other specified: Secondary | ICD-10-CM | POA: Insufficient documentation

## 2015-03-28 DIAGNOSIS — Z880 Allergy status to penicillin: Secondary | ICD-10-CM | POA: Insufficient documentation

## 2015-03-28 DIAGNOSIS — Y92814 Boat as the place of occurrence of the external cause: Secondary | ICD-10-CM | POA: Diagnosis not present

## 2015-03-28 DIAGNOSIS — X58XXXA Exposure to other specified factors, initial encounter: Secondary | ICD-10-CM | POA: Diagnosis not present

## 2015-03-28 DIAGNOSIS — Z3202 Encounter for pregnancy test, result negative: Secondary | ICD-10-CM | POA: Insufficient documentation

## 2015-03-28 DIAGNOSIS — Z720 Tobacco use: Secondary | ICD-10-CM | POA: Diagnosis not present

## 2015-03-28 DIAGNOSIS — S83412A Sprain of medial collateral ligament of left knee, initial encounter: Secondary | ICD-10-CM | POA: Insufficient documentation

## 2015-03-28 DIAGNOSIS — Y998 Other external cause status: Secondary | ICD-10-CM | POA: Diagnosis not present

## 2015-03-28 DIAGNOSIS — M25462 Effusion, left knee: Secondary | ICD-10-CM | POA: Diagnosis not present

## 2015-03-28 DIAGNOSIS — S8992XA Unspecified injury of left lower leg, initial encounter: Secondary | ICD-10-CM | POA: Diagnosis present

## 2015-03-28 LAB — POCT PREGNANCY, URINE: PREG TEST UR: NEGATIVE

## 2015-03-28 MED ORDER — IBUPROFEN 800 MG PO TABS: 800.0000 mg | ORAL_TABLET | Freq: Three times a day (TID) | ORAL | Status: DC | PRN

## 2015-03-28 MED ORDER — IBUPROFEN 800 MG PO TABS
800.0000 mg | ORAL_TABLET | Freq: Once | ORAL | Status: AC
  Administered 2015-03-28: 800 mg via ORAL
  Filled 2015-03-28: qty 1

## 2015-03-28 MED ORDER — OXYCODONE-ACETAMINOPHEN 5-325 MG PO TABS
1.0000 | ORAL_TABLET | Freq: Once | ORAL | Status: AC
  Administered 2015-03-28: 1 via ORAL
  Filled 2015-03-28: qty 1

## 2015-03-28 MED ORDER — OXYCODONE-ACETAMINOPHEN 7.5-325 MG PO TABS: 1.0000 | ORAL_TABLET | Freq: Four times a day (QID) | ORAL | Status: DC | PRN
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
## 2015-03-28 NOTE — Discharge Instructions (Signed)
Ambulate with crutches pending Ortho evaluation.

## 2015-07-26 ENCOUNTER — Encounter: Payer: Self-pay | Admitting: Podiatry

## 2015-10-02 ENCOUNTER — Other Ambulatory Visit: Payer: Self-pay | Admitting: Neurology

## 2015-10-02 DIAGNOSIS — R4781 Slurred speech: Secondary | ICD-10-CM

## 2015-10-02 DIAGNOSIS — R471 Dysarthria and anarthria: Secondary | ICD-10-CM

## 2015-10-02 DIAGNOSIS — Z8673 Personal history of transient ischemic attack (TIA), and cerebral infarction without residual deficits: Secondary | ICD-10-CM

## 2015-10-02 DIAGNOSIS — R413 Other amnesia: Secondary | ICD-10-CM

## 2015-10-18 ENCOUNTER — Emergency Department: Payer: Medicaid Other

## 2015-10-18 ENCOUNTER — Emergency Department
Admission: EM | Admit: 2015-10-18 | Discharge: 2015-10-18 | Disposition: A | Discharge status: Home / Self Care | Payer: Medicaid Other | Attending: Emergency Medicine | Admitting: Emergency Medicine

## 2015-10-18 DIAGNOSIS — R471 Dysarthria and anarthria: Secondary | ICD-10-CM | POA: Diagnosis not present

## 2015-10-18 DIAGNOSIS — R531 Weakness: Secondary | ICD-10-CM

## 2015-10-18 DIAGNOSIS — Z8673 Personal history of transient ischemic attack (TIA), and cerebral infarction without residual deficits: Secondary | ICD-10-CM | POA: Insufficient documentation

## 2015-10-18 DIAGNOSIS — F17210 Nicotine dependence, cigarettes, uncomplicated: Secondary | ICD-10-CM | POA: Diagnosis not present

## 2015-10-18 DIAGNOSIS — R4701 Aphasia: Secondary | ICD-10-CM | POA: Diagnosis present

## 2015-10-18 LAB — COMPREHENSIVE METABOLIC PANEL
ALBUMIN: 4.2 g/dL (ref 3.5–5.0)
ALT: 11 U/L — AB (ref 14–54)
AST: 16 U/L (ref 15–41)
Alkaline Phosphatase: 38 U/L (ref 38–126)
Anion gap: 6 (ref 5–15)
BUN: 6 mg/dL (ref 6–20)
CHLORIDE: 104 mmol/L (ref 101–111)
CO2: 24 mmol/L (ref 22–32)
Calcium: 8.7 mg/dL — ABNORMAL LOW (ref 8.9–10.3)
Creatinine, Ser: 0.63 mg/dL (ref 0.44–1.00)
GFR calc Af Amer: >60 mL/min (ref >60)
GFR calc non Af Amer: >60 mL/min (ref >60)
GLUCOSE: 78 mg/dL (ref 65–99)
POTASSIUM: 3.4 mmol/L — AB (ref 3.5–5.1)
Sodium: 134 mmol/L — ABNORMAL LOW (ref 135–145)
Total Bilirubin: 0.4 mg/dL (ref 0.3–1.2)
Total Protein: 7.3 g/dL (ref 6.5–8.1)

## 2015-10-18 LAB — CBC WITH DIFFERENTIAL/PLATELET
Basophils Absolute: 0.1 10*3/uL (ref 0–0.1)
Basophils Relative: 1 %
EOS PCT: 1 %
Eosinophils Absolute: 0.2 10*3/uL (ref 0–0.7)
HCT: 36.5 % (ref 35.0–47.0)
Hemoglobin: 12.2 g/dL (ref 12.0–16.0)
LYMPHS ABS: 2.7 10*3/uL (ref 1.0–3.6)
LYMPHS PCT: 22 %
MCH: 29.3 pg (ref 26.0–34.0)
MCHC: 33.4 g/dL (ref 32.0–36.0)
MCV: 87.8 fL (ref 80.0–100.0)
MONO ABS: 0.7 10*3/uL (ref 0.2–0.9)
MONOS PCT: 6 %
Neutro Abs: 8.3 10*3/uL — ABNORMAL HIGH (ref 1.4–6.5)
Neutrophils Relative %: 70 %
PLATELETS: 241 10*3/uL (ref 150–440)
RBC: 4.16 MIL/uL (ref 3.80–5.20)
RDW: 13.2 % (ref 11.5–14.5)
WBC: 11.9 10*3/uL — ABNORMAL HIGH (ref 3.6–11.0)

## 2015-10-18 LAB — APTT: APTT: 29 s (ref 24–36)

## 2015-10-18 LAB — PROTIME-INR
INR: 1.06
Prothrombin Time: 14 seconds (ref 11.4–15.0)

## 2015-10-18 MED ORDER — LORAZEPAM 2 MG/ML IJ SOLN
1.0000 mg | Freq: Once | INTRAMUSCULAR | Status: AC
  Administered 2015-10-18: 1 mg via INTRAVENOUS
  Filled 2015-10-18: qty 1

## 2015-10-18 MED ORDER — BUTALBITAL-APAP-CAFFEINE 50-325-40 MG PO TABS: 1.0000 | ORAL_TABLET | Freq: Four times a day (QID) | ORAL | Status: AC | PRN

## 2015-10-18 NOTE — ED Provider Notes (Signed)
[facility] Emergency Department Provider Note  Time seen: 8:12 PM  I have reviewed the triage vital signs and the nursing notes.   HISTORY  Chief Complaint Aphasia    HPI Dolly RiasBrandy Baker is a 30 y.o. female with a past medical history of CVA, presents the emergency department with altered speech. According to the patient she had a CVA approximately 2 years ago at [facility]. At that time she had deficits on her left side and difficulty talking along with confusion. She ultimately was discharged from the hospital, however she states since that time she has been experiencing anxiety and panic attacks which reproduced the same symptoms including left-sided deficits and trouble speaking. Husband states this happens almost every day however it is usually short-lived. States it is much worse after she gets worked up for his arguing, and states tonight they were arguing when her symptoms started. Patient states she is having left-sided weakness, and trouble speaking which feels similar to the symptoms she experiences nearly every day except today she states she was also experiencing a headache which is abnormal for her. She states since the headache is largely resolved and the symptoms are improving. Describes her symptoms as moderate when they occur.     Past Medical History  Diagnosis Date  . Seizures (HCC)   . Head trauma   . Headache(784.0)   . Stroke [facility](HCC)     Patient Active Problem List   Diagnosis Date Noted  . CVA (cerebral infarction) 11/16/2012  . Cocaine abuse 11/16/2012  . 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 Altered mental status 11/15/2012  . Abnormal CT scan of head 11/15/2012  . Encephalopathy acute 11/15/2012  . 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 Migraine headache 11/15/2012    Past Surgical History  Procedure Laterality Date  . Cesarean section      X 4  . Tee without cardioversion N/A 11/17/2012    Procedure: TRANSESOPHAGEAL ECHOCARDIOGRAM (TEE);   Surgeon: Dolores Pattyaniel R Bensimhon, MD;  [facility] END[facility]COPY;  Service: Cardiovascular;  Laterality: N/A;    No current outpatient prescriptions on file.  Allergies Bee venom and Penicillins  No family history on file.  Social History Social History  Substance Use Topics  . Smoking status: Current Every Day Smoker -- 0.50 packs/day for 15 years    Types: Cigarettes  . Smokeless tobacco: None  . Alcohol Use: No    Review of Systems Constitutional: Negative for fever Cardiovascular: Negative for chest pain. Respiratory: Negative for shortness of breath. Gastrointestinal: Negative for abdominal pain Genitourinary: Negative for dysuria. Musculoskeletal: Negative for back pain. Neurological: Mild headache, largely resolved, left arm and leg weakness, trouble speaking. 10-point ROS otherwise negative.  ____________________________________________   PHYSICAL EXAM:  VITAL SIGNS: ED Triage Vitals  Enc Vitals Group     BP 10/18/15 1901 161/110 mmHg     Pulse Rate 10/18/15 1901 76     Resp 10/18/15 1901 14     Temp 10/18/15 1901 98.6 F (37 C)     Temp Source 10/18/15 1901 Oral     SpO2 10/18/15 1901 100 %     Weight --      Height --      Head Cir --      Peak Flow --      Pain Score 10/18/15 1902 8     Pain Loc --      Pain Edu? --      Excl. in GC? --     Constitutional: Alert and  oriented. Well appearing and in no distress. Eyes: Normal exam ENT   Head: Normocephalic and atraumatic   Mouth/Throat: Mucous membranes are moist. Cardiovascular: Normal rate, regular rhythm. No murmur Respiratory: Normal respiratory effort without tachypnea nor retractions. Breath sounds are clear Gastrointestinal: Soft and nontender. No distention Musculoskeletal: Nontender with normal range of motion in all extremities.  Neurologic:  Slurred speech at times, however at times when the patient is in conversation she speaks very fluently and clear. Patient does have 4/5 motor in  left upper and left lower extremity however states she was able to ambulate. Sensation is intact bilaterally. Cranial nerves intact. Skin:  Skin is warm, dry and intact.  Psychiatric: Mood and affect are normal. Speech and behavior are normal.  ____________________________________________     RADIOLOGY  CT shows old infarct, no acute abnormality  ____________________________________________    INITIAL IMPRESSION / ASSESSMENT AND PLAN / ED COURSE  Pertinent labs & imaging results that were available during my care of the patient were reviewed by me and considered in my medical decision making (see chart for details).  Patient presents with left-sided deficit/weakness along with trouble speaking. Patient states she first The symptoms approximately 2.5 years ago when she was diagnosed with a CVA at [facility]. Since that time she continues to get recurrent symptoms of the left-sided weakness along with trouble speaking anytime she gets worked up. Tonight she states they were arguing, and her symptoms began however tonight they also were accompanied by a left-sided headache which was abnormal so she came to the emergency department for evaluation. The patient states the headache is nearly gone but she continues to feel weakness in her left upper and lower extremities. She states it feels like anxiety/"panic attack." Given the patient's history of stroke and left-sided deficits on exam we'll proceed with a CT scan as well as lab work to further evaluate. Patient is agreeable to this plan. We will also treat her anxiety with Ativan in the emergency department and monitor for improvement.  Labs are largely within normal limits. CT shows no acute findings. Patient's symptoms have resolved, states the weakness is much improved, talking fluently without any speech difficulties. Patient states she thinks it's her anxiety but states her psychiatrist will write her for any anxiety medication. She  states she gets headaches nearly every day but today's headache was worse although she states is largely gone at this time. I'll write the patient for Fioricet, she is to follow-up with her psychiatrist, I will also refer the patient to neurology for further evaluation.  ____________________________________________   FINAL CLINICAL IMPRESSION(S) / ED DIAGNOSES  Left-sided weakness Dysarthria   Minna Antis, MD 10/18/15 2102

## 2015-10-18 NOTE — Discharge Instructions (Signed)
Please call the number provided for Dr. Malvin JohnsPotter to arrange a follow-up appointment to be seen by neurology. Return to the emergency department for any increased weakness, headache, confusion, or return of any difficulty speaking.   Weakness Weakness is a lack of strength. It may be felt all over the body (generalized) or in one specific part of the body (focal). Some causes of weakness can be serious. You may need further medical evaluation, especially if you are elderly or you have a history of immunosuppression (such as chemotherapy or HIV), kidney disease, heart disease, or diabetes. CAUSES  Weakness can be caused by many different things, including:  Infection.  Physical exhaustion.  Internal bleeding or other blood loss that results in a lack of red blood cells (anemia).  Dehydration. This cause is more common in elderly people.  Side effects or electrolyte abnormalities from medicines, such as pain medicines or sedatives.  Emotional distress, anxiety, or depression.  Circulation problems, especially severe peripheral arterial disease.  Heart disease, such as rapid atrial fibrillation, bradycardia, or heart failure.  Nervous system disorders, such as Guillain-Barr syndrome, multiple sclerosis, or stroke. DIAGNOSIS  To find the cause of your weakness, your caregiver will take your history and perform a physical exam. Lab tests or X-rays may also be ordered, if needed. TREATMENT  Treatment of weakness depends on the cause of your symptoms and can vary greatly. HOME CARE INSTRUCTIONS   Rest as needed.  Eat a well-balanced diet.  Try to get some exercise every day.  Only take over-the-counter or prescription medicines as directed by your caregiver. SEEK MEDICAL CARE IF:   Your weakness seems to be getting worse or spreads to other parts of your body.  You develop new aches or pains. SEEK IMMEDIATE MEDICAL CARE IF:   You cannot perform your normal daily activities, such  as getting dressed and feeding yourself.  You cannot walk up and down stairs, or you feel exhausted when you do so.  You have shortness of breath or chest pain.  You have difficulty moving parts of your body.  You have weakness in only one area of the body or on only one side of the body.  You have a fever.  You have trouble speaking or swallowing.  You cannot control your bladder or bowel movements.  You have black or bloody vomit or stools. MAKE SURE YOU:  Understand these instructions.  Will watch your condition.  Will get help right away if you are not doing well or get worse.   This information is not intended to replace advice given to you by your health care provider. Make sure you discuss any questions you have with your health care provider.   Document Released: 07/01/2005 Document Revised: 12/31/2011 Document Reviewed: 08/30/2011 Elsevier Interactive Patient Education [employer]2016 Elsevier Inc.

## 2015-10-18 NOTE — ED Notes (Addendum)
Pt bib EMS w/ c/o chgs in speech.  Per EMS, pt is speaking more clearly than on scene.  Pt sts that periodically she will experience "panic attacks" that cause her to have difficulty speaking.  Pt able to communicate thoughts clearly in room.  Per EMS pt does have hx of CVA.  Pt sts she has R facial droop normally as result of previous stroke and decr sensation on L side.

## 2015-10-24 ENCOUNTER — Ambulatory Visit: Admission: RE | Admit: 2015-10-24 | Payer: Medicaid Other | Source: Ambulatory Visit

## 2016-07-30 DIAGNOSIS — M542 Cervicalgia: Secondary | ICD-10-CM | POA: Insufficient documentation

## 2016-07-30 DIAGNOSIS — Z8673 Personal history of transient ischemic attack (TIA), and cerebral infarction without residual deficits: Secondary | ICD-10-CM | POA: Insufficient documentation

## 2016-07-30 DIAGNOSIS — I10 Essential (primary) hypertension: Secondary | ICD-10-CM | POA: Insufficient documentation

## 2016-07-30 DIAGNOSIS — R200 Anesthesia of skin: Secondary | ICD-10-CM | POA: Insufficient documentation

## 2016-08-13 ENCOUNTER — Ambulatory Visit: Payer: Medicaid Other | Attending: Neurology | Admitting: Occupational Therapy

## 2016-08-13 ENCOUNTER — Encounter: Payer: Self-pay | Admitting: Occupational Therapy

## 2016-08-13 DIAGNOSIS — R278 Other lack of coordination: Secondary | ICD-10-CM | POA: Insufficient documentation

## 2016-08-13 DIAGNOSIS — M6281 Muscle weakness (generalized): Secondary | ICD-10-CM
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
## 2016-08-21 ENCOUNTER — Ambulatory Visit: Payer: Medicaid Other | Admitting: Physical Therapy

## 2017-11-09 DIAGNOSIS — R4701 Aphasia: Principal | ICD-10-CM | POA: Insufficient documentation

## 2017-11-09 DIAGNOSIS — F17210 Nicotine dependence, cigarettes, uncomplicated: Secondary | ICD-10-CM | POA: Insufficient documentation

## 2017-11-09 DIAGNOSIS — I10 Essential (primary) hypertension: Secondary | ICD-10-CM | POA: Insufficient documentation

## 2017-11-09 DIAGNOSIS — Z8673 Personal history of transient ischemic attack (TIA), and cerebral infarction without residual deficits: Secondary | ICD-10-CM | POA: Diagnosis not present

## 2017-11-09 DIAGNOSIS — F329 Major depressive disorder, single episode, unspecified: Secondary | ICD-10-CM | POA: Insufficient documentation

## 2017-11-09 DIAGNOSIS — Z79899 Other long term (current) drug therapy: Secondary | ICD-10-CM | POA: Diagnosis not present

## 2017-11-09 DIAGNOSIS — Z7982 Long term (current) use of aspirin: Secondary | ICD-10-CM | POA: Diagnosis not present

## 2017-11-09 DIAGNOSIS — Y92009 Unspecified place in unspecified non-institutional (private) residence as the place of occurrence of the external cause: Secondary | ICD-10-CM | POA: Diagnosis not present

## 2017-11-09 DIAGNOSIS — Z91030 Bee allergy status: Secondary | ICD-10-CM | POA: Diagnosis not present

## 2017-11-09 DIAGNOSIS — F1410 Cocaine abuse, uncomplicated: Secondary | ICD-10-CM | POA: Diagnosis not present

## 2017-11-09 DIAGNOSIS — W108XXA Fall (on) (from) other stairs and steps, initial encounter: Secondary | ICD-10-CM | POA: Insufficient documentation

## 2017-11-09 DIAGNOSIS — Z880 Allergy status to penicillin: Secondary | ICD-10-CM | POA: Insufficient documentation

## 2017-11-09 DIAGNOSIS — M546 Pain in thoracic spine: Secondary | ICD-10-CM | POA: Insufficient documentation

## 2017-11-09 DIAGNOSIS — R531 Weakness: Secondary | ICD-10-CM | POA: Insufficient documentation

## 2017-11-09 DIAGNOSIS — E876 Hypokalemia: Secondary | ICD-10-CM | POA: Insufficient documentation

## 2017-11-09 DIAGNOSIS — F1510 Other stimulant abuse, uncomplicated: Secondary | ICD-10-CM | POA: Insufficient documentation

## 2017-11-09 NOTE — ED Triage Notes (Signed)
Pt found lying face first on ground, drooling, pt nonverbal,

## 2017-11-10 ENCOUNTER — Emergency Department: Payer: Medicaid Other

## 2017-11-10 ENCOUNTER — Observation Stay: Payer: Medicaid Other

## 2017-11-10 ENCOUNTER — Encounter: Payer: Self-pay | Admitting: Emergency Medicine

## 2017-11-10 ENCOUNTER — Other Ambulatory Visit: Payer: Self-pay

## 2017-11-10 ENCOUNTER — Observation Stay
Admission: EM | Admit: 2017-11-10 | Discharge: 2017-11-11 | Disposition: A | Discharge status: Home / Self Care | Payer: Medicaid Other | Attending: Internal Medicine | Admitting: Internal Medicine

## 2017-11-10 DIAGNOSIS — F1910 Other psychoactive substance abuse, uncomplicated: Secondary | ICD-10-CM

## 2017-11-10 DIAGNOSIS — R4701 Aphasia: Secondary | ICD-10-CM | POA: Diagnosis present

## 2017-11-10 DIAGNOSIS — R531 Weakness: Secondary | ICD-10-CM

## 2017-11-10 LAB — URINE DRUG SCREEN, QUALITATIVE (ARMC ONLY)
Amphetamines, Ur Screen: NOT DETECTED
BENZODIAZEPINE, UR SCRN: NOT DETECTED
Barbiturates, Ur Screen: NOT DETECTED
CANNABINOID 50 NG, UR ~~LOC~~: NOT DETECTED
Cocaine Metabolite,Ur ~~LOC~~: POSITIVE — AB
MDMA (Ecstasy)Ur Screen: NOT DETECTED
Methadone Scn, Ur: NOT DETECTED
Opiate, Ur Screen: NOT DETECTED
PHENCYCLIDINE (PCP) UR S: NOT DETECTED
Tricyclic, Ur Screen: NOT DETECTED

## 2017-11-10 LAB — CBC
HEMATOCRIT: 39.4 % (ref 35.0–47.0)
HEMOGLOBIN: 13.2 g/dL (ref 12.0–16.0)
MCH: 29.3 pg (ref 26.0–34.0)
MCHC: 33.5 g/dL (ref 32.0–36.0)
MCV: 87.6 fL (ref 80.0–100.0)
Platelets: 326 10*3/uL (ref 150–440)
RBC: 4.5 MIL/uL (ref 3.80–5.20)
RDW: 14.7 % — ABNORMAL HIGH (ref 11.5–14.5)
WBC: 10.9 10*3/uL (ref 3.6–11.0)

## 2017-11-10 LAB — COMPREHENSIVE METABOLIC PANEL
ALT: 19 U/L (ref 14–54)
AST: 28 U/L (ref 15–41)
Albumin: 4.3 g/dL (ref 3.5–5.0)
Alkaline Phosphatase: 39 U/L (ref 38–126)
Anion gap: 10 (ref 5–15)
BILIRUBIN TOTAL: 0.5 mg/dL (ref 0.3–1.2)
BUN: 9 mg/dL (ref 6–20)
CO2: 23 mmol/L (ref 22–32)
CREATININE: 0.68 mg/dL (ref 0.44–1.00)
Calcium: 9 mg/dL (ref 8.9–10.3)
Chloride: 104 mmol/L (ref 101–111)
GFR calc Af Amer: >60 mL/min (ref >60)
Glucose, Bld: 88 mg/dL (ref 65–99)
Potassium: 3.3 mmol/L — ABNORMAL LOW (ref 3.5–5.1)
Sodium: 137 mmol/L (ref 135–145)
TOTAL PROTEIN: 7.8 g/dL (ref 6.5–8.1)

## 2017-11-10 LAB — APTT: APTT: 30 s (ref 24–36)

## 2017-11-10 LAB — URINALYSIS, ROUTINE W REFLEX MICROSCOPIC
BILIRUBIN URINE: NEGATIVE
Glucose, UA: NEGATIVE mg/dL
HGB URINE DIPSTICK: NEGATIVE
Ketones, ur: NEGATIVE mg/dL
Leukocytes, UA: NEGATIVE
Nitrite: NEGATIVE
PROTEIN: NEGATIVE mg/dL
Specific Gravity, Urine: 1.006 (ref 1.005–1.030)
pH: 6 (ref 5.0–8.0)

## 2017-11-10 LAB — DIFFERENTIAL
BASOS ABS: 0.1 10*3/uL (ref 0–0.1)
Basophils Relative: 1 %
EOS ABS: 0.1 10*3/uL (ref 0–0.7)
Eosinophils Relative: 1 %
LYMPHS ABS: 3.8 10*3/uL — AB (ref 1.0–3.6)
Lymphocytes Relative: 34 %
MONOS PCT: 9 %
Monocytes Absolute: 1 10*3/uL — ABNORMAL HIGH (ref 0.2–0.9)
NEUTROS ABS: 6 10*3/uL (ref 1.4–6.5)
Neutrophils Relative %: 55 %

## 2017-11-10 LAB — ETHANOL: Alcohol, Ethyl (B): <10 mg/dL (ref <10)

## 2017-11-10 LAB — TROPONIN I

## 2017-11-10 LAB — PROTIME-INR
INR: 0.96
Prothrombin Time: 12.7 seconds (ref 11.4–15.2)

## 2017-11-10 LAB — GLUCOSE, CAPILLARY: GLUCOSE-CAPILLARY: 92 mg/dL (ref 65–99)

## 2017-11-10 MED ORDER — ENOXAPARIN SODIUM 40 MG/0.4ML ~~LOC~~ SOLN
40.0000 mg | SUBCUTANEOUS | Status: DC
  Administered 2017-11-10: 10:00:00 40 mg via SUBCUTANEOUS
  Filled 2017-11-10: qty 0.4

## 2017-11-10 MED ORDER — ENOXAPARIN SODIUM 30 MG/0.3ML ~~LOC~~ SOLN: 30.0000 mg | SUBCUTANEOUS | Status: DC

## 2017-11-10 MED ORDER — LISINOPRIL 10 MG PO TABS
10.0000 mg | ORAL_TABLET | Freq: Every day | ORAL | Status: DC
  Administered 2017-11-10 – 2017-11-11 (×2): 10 mg via ORAL
  Filled 2017-11-10 (×2): qty 1

## 2017-11-10 MED ORDER — ACETAMINOPHEN 325 MG PO TABS
650.0000 mg | ORAL_TABLET | ORAL | Status: DC | PRN
  Administered 2017-11-10: 10:00:00 650 mg via ORAL
  Filled 2017-11-10: qty 2

## 2017-11-10 MED ORDER — STROKE: EARLY STAGES OF RECOVERY BOOK
Freq: Once | Status: AC
  Administered 2017-11-11: 10:00:00

## 2017-11-10 MED ORDER — SODIUM CHLORIDE 0.9 % IV BOLUS
1000.0000 mL | Freq: Once | INTRAVENOUS | Status: AC
  Administered 2017-11-10: 1000 mL via INTRAVENOUS

## 2017-11-10 MED ORDER — SERTRALINE HCL 50 MG PO TABS
50.0000 mg | ORAL_TABLET | Freq: Every day | ORAL | Status: DC
  Administered 2017-11-10: 21:00:00 50 mg via ORAL
  Filled 2017-11-10: qty 1

## 2017-11-10 MED ORDER — ATORVASTATIN CALCIUM 20 MG PO TABS
10.0000 mg | ORAL_TABLET | Freq: Every day | ORAL | Status: DC
  Administered 2017-11-10: 17:00:00 10 mg via ORAL
  Filled 2017-11-10: qty 1

## 2017-11-10 MED ORDER — ACETAMINOPHEN 650 MG RE SUPP: 650.0000 mg | RECTAL | Status: DC | PRN

## 2017-11-10 MED ORDER — ACETAMINOPHEN 160 MG/5ML PO SOLN
650.0000 mg | ORAL | Status: DC | PRN
  Filled 2017-11-10: qty 20.3

## 2017-11-10 MED ORDER — ASPIRIN 81 MG PO CHEW
81.0000 mg | CHEWABLE_TABLET | Freq: Every day | ORAL | Status: DC
  Administered 2017-11-10 – 2017-11-11 (×2): 81 mg via ORAL
  Filled 2017-11-10 (×2): qty 1

## 2017-11-10 MED ORDER — HYDRALAZINE HCL 20 MG/ML IJ SOLN
10.0000 mg | Freq: Once | INTRAMUSCULAR | Status: AC
  Administered 2017-11-10: 10 mg via INTRAVENOUS
  Filled 2017-11-10: qty 1

## 2017-11-10 MED ORDER — SENNOSIDES-DOCUSATE SODIUM 8.6-50 MG PO TABS: 1.0000 | ORAL_TABLET | Freq: Every evening | ORAL | Status: DC | PRN

## 2017-11-10 NOTE — ED Notes (Signed)
Pt awake, now speaking; pt says she's just started being able to speak in the last little bit; pt says she was going down her back stairs tonight and fell down 3-4 stairs; has history of progressive numbness to left side of her body since she had a stroke 4 years ago; she is unsure if her left leg gave out and she fell or if she may have tripped; c/o tenderness to upper thoracic spine; no redness/bruising or swelling noted to the rest of her back; also has tenderness to the right back side of her head where she believes she hit her head when she fell; no laceration noted;  pt is still unable to lift arms and legs;

## 2017-11-10 NOTE — Consult Note (Signed)
     [facility] Tele[facility]urology Consult Services  Impression:  Unresponsiveness   Patient presents with an unusual spell, she does not speak or follow commands, and does not cooperate with the exam.  She remains alert and attentive, and seems to be appropriately following visual cues, yet tearful and does not speak.  The presentation is unusual for acute ischemic stroke.   Discussed case with ED physician.  It might be worthwhile to obtain CTA head and neck to rule out underlying large vessel occlusive disease particularly because adequate examination cannot be obtained.  CT head was unremarkable. Not tPA candidate as there is no clear focal findings to suggest acue vascular event.      Comments: Arrival Time: 2347 [facility] contacted:  0018 [facility] at bedside: 0021 NIHSS assessment time: 0025    Discussed with ED MD Please call with questions  Derenda Mis, MD [facility]   -----------------------------------------------------------------------------------------  CC Stroke alert History of Present Illness   Patient is a 32 year old woman who was found by her husband down on the ground at approximately 1100 pm.   Her husband spoke with her on the phone at about 1016 pm.  Apparently since then she has been not speaking and not following commands, and not moving upper and lower limbs.  She was brought here by her husband.   Diagnostic: CT head reviewed.  Exam: NIHSS cannot be done.  Patient does not speak and does not follow commands.  No speech output.  Eyes open , track and appropriately respond to visual cues.  Exam otherwise limited.   Medical Decision Making:  - Extensive number of diagnosis or management options are considered above. - Extensive amount of complex data reviewed. - High risk of complication and/or morbidity or mortality are associated with differential diagnostic considerations above.  - There may be Uncertain outcome  and increased probability of prolonged functional impairment or high probability of severe prolonged functional impairment associated with some of these differential diagnosis.  Medical Data Reviewed:  1.Data reviewed include clinical labs, radiology,Medical Tests; 2.Tests results discussed w/performing or interpreting physician; 3.Obtaining/reviewing old medical records;  4.Obtaining case history from another source;  5.Independent review of image, tracing or specimen.    Patient was informed the Neurology Consult would happen via telehealth (remote video) and consented to receiving care in this manner.

## 2017-11-10 NOTE — ED Notes (Signed)
In to start IV fluids as ordered; pt sleeping soundly but noted to have moved left arm up over her head; able to move arm back to straight beside her for IV fluids; pt says she's unable to move her right arm to get to her armband for scanning;

## 2017-11-10 NOTE — Progress Notes (Signed)
OT Cancellation Note  Patient Details Name: Sheri Woods MRN: [id number] DOB: [birth year]   Cancelled Treatment:    Reason Eval/Treat Not Completed: Patient at procedure or test/ unavailable. Order received, chart reviewed. Pt out of room for diagnostic testing. Will re-attempt OT evaluation at later date/time as pt is available and medically appropriate.  Richrd Prime, MPH, MS, OTR/L ascom [phone] 11/10/17, 11:52 AM

## 2017-11-10 NOTE — Evaluation (Addendum)
Physical Therapy Evaluation Patient Details Name: Sheri Woods MRN: [id number] DOB: 07-23-1985 Today's Date: 11/10/2017   History of Present Illness  Pt admitted for aphasia. Currently being worked up for CVA symptoms. HIstory includes previous CVA and seizures.  Clinical Impression  Pt is a pleasant 32 year old female who was admitted for aphasia. Imaging negative at this time for CVA. Pt very inconsistent with strength testing and symptoms are distractible. Reports no sensation in L fingers and L LE. Pt performs bed mobility with cga, transfers with min assist without AD, and ambulation with cga to recliner. Further ambulation performed with AD, used RW with cga with improved balance and stability. Pt demonstrates deficits with strength in L LE > L UE/ mobility/balance. Recommend pt continue using RW for fall prevention. Would benefit from skilled PT to address above deficits and promote optimal return to PLOF. Will continue to progress.    Follow Up Recommendations Outpatient PT    Equipment Recommendations  Rolling walker with 5" wheels    Recommendations for Other Services       Precautions / Restrictions Precautions Precautions: Fall Restrictions Weight Bearing Restrictions: No      Mobility  Bed Mobility Overal bed mobility: Needs Assistance Bed Mobility: Supine to Sit     Supine to sit: Min guard     General bed mobility comments: able to transfer to stronger side (R). Uses railing for assistance. Once seated at EOB, able to sit with upright posture.  Transfers Overall transfer level: Needs assistance Equipment used: None Transfers: Sit to/from Stand Sit to Stand: Min assist         General transfer comment: Able to stand, favors L side. HHA given on L hand. No AD used  Ambulation/Gait Ambulation/Gait assistance: Min Environmental consultant (Feet): 3 Feet Assistive device: None Gait Pattern/deviations: Step-to pattern     General Gait Details: able  to ambulate with step to gait pattern to recliner. Safe technique, however does note inconsistencies with strength between in bed assessment and mobility assessment. Further ambulation performed in ther-ex  Stairs            Wheelchair Mobility    Modified Rankin (Stroke Patients Only)       Balance Overall balance assessment: History of Falls;Needs assistance Sitting-balance support: Feet supported Sitting balance-Leahy Scale: Good     Standing balance support: Bilateral upper extremity supported Standing balance-Leahy Scale: Good                               Pertinent Vitals/Pain Pain Assessment: No/denies pain    Home Living Family/patient expects to be discharged to:: (reports she is homeless, however talks about camper)                      Prior Function Level of Independence: Independent         Comments: reports she wanted a SPC but didn't have transportation     Hand Dominance        Extremity/Trunk Assessment   Upper Extremity Assessment Upper Extremity Assessment: Defer to OT evaluation    Lower Extremity Assessment Lower Extremity Assessment: Generalized weakness(L LE grossly 2/5 in bed, however atleast 3+/5 functional)       Communication   Communication: No difficulties  Cognition Arousal/Alertness: Awake/alert Behavior During Therapy: WFL for tasks assessed/performed Overall Cognitive Status: Within Functional Limits for tasks assessed  General Comments      Exercises Other Exercises Other Exercises: Pt able to ambulate with reciprocal gait pattern and RW x 40' in room. Safe technique with RW with demonstration given prior to use. Active L UE tricep noted with WBing, still guards L LE. Pt reports she feels stable using AD.   Assessment/Plan    PT Assessment Patient needs continued PT services  PT Problem List Decreased strength;Decreased  balance;Decreased mobility;Decreased knowledge of use of DME       PT Treatment Interventions Gait training;Therapeutic exercise;Balance training;DME instruction    PT Goals (Current goals can be found in the Care Plan section)  Acute Rehab PT Goals Patient Stated Goal: to get stronger PT Goal Formulation: With patient Time For Goal Achievement: 11/24/17 Potential to Achieve Goals: Good    Frequency Min 2X/week   Barriers to discharge        Co-evaluation               AM-PAC PT "6 Clicks" Daily Activity  Outcome Measure Difficulty turning over in bed (including adjusting bedclothes, sheets and blankets)?: Unable Difficulty moving from lying on back to sitting on the side of the bed? : Unable Difficulty sitting down on and standing up from a chair with arms (e.g., wheelchair, bedside commode, etc,.)?: Unable Help needed moving to and from a bed to chair (including a wheelchair)?: A Little Help needed walking in hospital room?: A Little Help needed climbing 3-5 steps with a railing? : A Lot 6 Click Score: 11    End of Session   Activity Tolerance: Patient tolerated treatment well Patient left: in chair;with chair alarm set Nurse Communication: Mobility status PT Visit Diagnosis: Unsteadiness on feet (R26.81);Muscle weakness (generalized) (M62.81);Difficulty in walking, not elsewhere classified (R26.2)    Time: 1610-9604 PT Time Calculation (min) (ACUTE ONLY): 32 min   Charges:   PT Evaluation $PT Eval Low Complexity: 1 Low PT Treatments $Gait Training: 8-22 mins   PT G CodesElizabeth Palau, PT, DPT [phone]   Tevan Marian 11/10/2017, 5:04 PM

## 2017-11-10 NOTE — ED Notes (Signed)
In to give pt's husband something to eat and drink as requested; pt is tearful; says he's in a bad mood and she told him to just go home; husband is not in the room at this time; pt understands NPO until CT results;

## 2017-11-10 NOTE — ED Notes (Addendum)
Sheri Daft, RN speaking with pt's husband who is at bedside; he states since pt's CVA a few years ago, pt has had intermittent episodes of having trouble speaking and moving and they've attributed this to exhaustion; husband states they have been working hard all weekend and pt was c/o feeling tired all day today

## 2017-11-10 NOTE — ED Notes (Signed)
Selena Batten, RN to transport pt to 1C-107.

## 2017-11-10 NOTE — ED Notes (Signed)
Dr Aleene Davidson, neurology on phone; pt unable to participate at all in evaluation;

## 2017-11-10 NOTE — ED Notes (Signed)
Kayla, RN-telestroke on computer at foot of bed

## 2017-11-10 NOTE — ED Notes (Addendum)
Dr Zenda Alpers at bedside; pt unable to speak but tearful; unable to say if any feeling when face touched on either side; not moving toes when asked by MD; pt with no resistance to gravity x 4 limbs; pt with history of CVA about 4 years ago; husband says pt is supposed to be seeing Dr Malvin Johns regularly and taking medication for HTN but does not do either

## 2017-11-10 NOTE — Consult Note (Signed)
Reason for Consult: inability to move Referring Physician: Dr. Luberta Mutter   CC: unresponsiveness/inability to move.   HPI: Sheri Woods is an 32 y.o. female with hx of suspected stroke who comes into the hospital today unable to speak or move her extremities.  The patient's husband states that they were fixing cars all weekend.  They came home a few hours ago and she was fixing noodles at a neighbors house.  The patient's husband states that he called her over to get some cigarettes and then he went to smoke on the porch.  He reports that he thought she went back over to the neighbor's house but when he called over to the neighbors about an hour later the neighbor states that they had not seen her.  The patient's husband went looking for the patient and after some time found her on the floor in the back yard drooling but awake. When pt is questioned she is unable to tell me full story and how she ended up here.  Utox positive for cocaine and she states last used about 2 days ago.     Past Medical History:  Diagnosis Date  . Head trauma   . Headache(784.0)   . Seizures (HCC)   . Stroke [facility])     Past Surgical History:  Procedure Laterality Date  . CESAREAN SECTION     X 4  . TEE WITHOUT CARDIOVERSION N/A 11/17/2012   Procedure: TRANSESOPHAGEAL ECHOCARDIOGRAM (TEE);  Surgeon: Dolores Patty, MD;  Location: [facility] ENDOSCOPY;  Service: Cardiovascular;  Laterality: N/A;    History reviewed. No pertinent family history.  Social History:  reports that she has been smoking cigarettes.  She has a 7.50 pack-year smoking history. She has never used smokeless tobacco. She reports that she has current or past drug history. Drug: Cocaine. She reports that she does not drink alcohol.  Allergies  Allergen Reactions  . Bee Venom Anaphylaxis  . Penicillins Hives and Other (See Comments)    Has patient had a PCN reaction causing immediate rash, facial/tongue/throat swelling, SOB or lightheadedness with  hypotension: No Has patient had a PCN reaction causing severe rash involving mucus membranes or skin necrosis: No Has patient had a PCN reaction that required hospitalization No Has patient had a PCN reaction occurring within the last 10 years: No If all of the above answers are "NO", then may proceed with Cephalosporin use.    Medications: I have reviewed the patient's current medications.  ROS: Unable to obtain due to confusion   Physical Examination: Blood pressure (!) 143/94, pulse 73, temperature 98.4 F (36.9 C), temperature source Oral, resp. rate 18, height  (1.6 m), weight 94 lb 8 oz (42.9 kg), SpO2 100 %.    Neurological Examination   Mental Status: Alert to name only. She is following commands.  Cranial Nerves: II: Discs flat bilaterally; Visual fields grossly normal, pupils equal, round, reactive to light and accommodation III,IV, VI: ptosis not present, extra-ocular motions intact bilaterally V,VII: smile symmetric, facial light touch sensation normal bilaterally VIII: hearing normal bilaterally IX,X: gag reflex present XI: bilateral shoulder shrug XII: midline tongue extension Motor: Pt's exam changes. She states she is is unable to move either leg and LUE.  But she is distractible and exam changes.  Tone and bulk:normal tone throughout; no atrophy noted Sensory: Pinprick and light touch intact throughout, bilaterally Deep Tendon Reflexes: 1+ and symmetric throughout Plantars: Right: downgoing   Left: downgoing Cerebellar: Not tested Gait: not tested  Laboratory Studies:   Basic Metabolic Panel: Recent Labs  Lab 11/10/17 0016  NA 137  K 3.3*  CL 104  CO2 23  GLUCOSE 88  BUN 9  CREATININE 0.68  CALCIUM 9.0    Liver Function Tests: Recent Labs  Lab 11/10/17 0016  AST 28  ALT 19  ALKPHOS 39  BILITOT 0.5  PROT 7.8  ALBUMIN 4.3   No results for input(s): LIPASE, AMYLASE in the last 168 hours. No results for input(s): AMMONIA in  the last 168 hours.  CBC: Recent Labs  Lab 11/10/17 0016  WBC 10.9  NEUTROABS 6.0  HGB 13.2  HCT 39.4  MCV 87.6  PLT 326    Cardiac Enzymes: Recent Labs  Lab 11/10/17 0016  TROPONINI <0.03    BNP: Invalid input(s): POCBNP  CBG: Recent Labs  Lab 11/10/17 0015  GLUCAP 92    Microbiology: No results found for this or any previous visit.  Coagulation Studies: Recent Labs    11/10/17 0016  LABPROT 12.7  INR 0.96    Urinalysis:  Recent Labs  Lab 11/10/17 0128  COLORURINE STRAW*  LABSPEC 1.006  PHURINE 6.0  GLUCOSEU NEGATIVE  HGBUR NEGATIVE  BILIRUBINUR NEGATIVE  KETONESUR NEGATIVE  PROTEINUR NEGATIVE  NITRITE NEGATIVE  LEUKOCYTESUR NEGATIVE    Lipid Panel:     Component Value Date/Time   CHOL 136 11/16/2012 0539   TRIG 92 11/16/2012 0539   HDL 38 (L) 11/16/2012 0539   CHOLHDL 3.6 11/16/2012 0539   VLDL 18 11/16/2012 0539   LDLCALC 80 11/16/2012 0539    HgbA1C:  Lab Results  Component Value Date   HGBA1C 5.8 (H) 11/16/2012    Urine Drug Screen:      Component Value Date/Time   LABOPIA NONE DETECTED 11/10/2017 0128   LABOPIA NONE DETECTED 11/14/2012 2157   COCAINSCRNUR POSITIVE (A) 11/10/2017 0128   LABBENZ NONE DETECTED 11/10/2017 0128   LABBENZ NONE DETECTED 11/14/2012 2157   AMPHETMU NONE DETECTED 11/10/2017 0128   AMPHETMU NONE DETECTED 11/14/2012 2157   THCU NONE DETECTED 11/10/2017 0128   THCU POSITIVE (A) 11/14/2012 2157   LABBARB NONE DETECTED 11/10/2017 0128   LABBARB NONE DETECTED 11/14/2012 2157    Alcohol Level:  Recent Labs  Lab 11/10/17 0016  ETH <10    Other results: EKG: normal EKG, normal sinus rhythm, unchanged from previous tracings.  Imaging: Ct Cervical Spine Wo Contrast  Result Date: 11/10/2017 CLINICAL DATA:  Initial evaluation for mid back and thoracic spine pain. EXAM: CT CERVICAL SPINE WITHOUT CONTRAST CT THORACIC SPINE WITHOUT CONTRAST TECHNIQUE: Multidetector CT imaging of the cervical and  thoracic spine was performed without contrast. Multiplanar CT image reconstructions were also generated. COMPARISON:  Prior radiograph from 02/07/2014. FINDINGS: CT CERVICAL SPINE FINDINGS Alignment: Straightening of the normal cervical lordosis. No listhesis. Skull base and vertebrae: Skull base intact. Normal C1-2 articulations are preserved in the dens is intact. Vertebral body heights well maintained. No acute fracture. Soft tissues and spinal canal: Soft tissues of the neck demonstrate no acute abnormality. No abnormal prevertebral edema. Spinal canal within normal limits. Disc levels: No significant degenerative changes within the cervical spine. Upper chest: Visualized upper chest unremarkable. Visualized lungs are grossly clear. Other: None. CT THORACIC SPINE FINDINGS Alignment: Examination technically limited by motion artifact. Vertebral bodies normally aligned with preservation of the normal thoracic kyphosis. No listhesis or malalignment. Vertebrae: Vertebral body heights maintained without evidence for acute or chronic fracture. No discrete lytic or blastic osseous lesions. Paraspinal and  other soft tissues: Paraspinous soft tissues demonstrate no acute abnormality. Partially visualized lungs are grossly clear. Disc levels: No significant degenerative changes seen within the thoracic spine. No appreciable disc protrusion or significant disc bulge. No significant canal or foraminal stenosis. No significant facet degeneration. IMPRESSION: Normal CT of the cervical and thoracic spine. No acute traumatic injury identified. No significant degenerative change or stenosis. Electronically Signed   By: Rise Mu M.D.   On: 11/10/2017 04:05   Ct Thoracic Spine Wo Contrast  Result Date: 11/10/2017 CLINICAL DATA:  Initial evaluation for mid back and thoracic spine pain. EXAM: CT CERVICAL SPINE WITHOUT CONTRAST CT THORACIC SPINE WITHOUT CONTRAST TECHNIQUE: Multidetector CT imaging of the cervical and  thoracic spine was performed without contrast. Multiplanar CT image reconstructions were also generated. COMPARISON:  Prior radiograph from 02/07/2014. FINDINGS: CT CERVICAL SPINE FINDINGS Alignment: Straightening of the normal cervical lordosis. No listhesis. Skull base and vertebrae: Skull base intact. Normal C1-2 articulations are preserved in the dens is intact. Vertebral body heights well maintained. No acute fracture. Soft tissues and spinal canal: Soft tissues of the neck demonstrate no acute abnormality. No abnormal prevertebral edema. Spinal canal within normal limits. Disc levels: No significant degenerative changes within the cervical spine. Upper chest: Visualized upper chest unremarkable. Visualized lungs are grossly clear. Other: None. CT THORACIC SPINE FINDINGS Alignment: Examination technically limited by motion artifact. Vertebral bodies normally aligned with preservation of the normal thoracic kyphosis. No listhesis or malalignment. Vertebrae: Vertebral body heights maintained without evidence for acute or chronic fracture. No discrete lytic or blastic osseous lesions. Paraspinal and other soft tissues: Paraspinous soft tissues demonstrate no acute abnormality. Partially visualized lungs are grossly clear. Disc levels: No significant degenerative changes seen within the thoracic spine. No appreciable disc protrusion or significant disc bulge. No significant canal or foraminal stenosis. No significant facet degeneration. IMPRESSION: Normal CT of the cervical and thoracic spine. No acute traumatic injury identified. No significant degenerative change or stenosis. Electronically Signed   By: Rise Mu M.D.   On: 11/10/2017 04:05   Ct Head Code Stroke Wo Contrast`  Result Date: 11/10/2017 CLINICAL DATA:  Code stroke. Initial evaluation for acute altered mental status. EXAM: CT HEAD WITHOUT CONTRAST TECHNIQUE: Contiguous axial images were obtained from the base of the skull through the  vertex without intravenous contrast. COMPARISON:  Prior CT from 10/18/2015. FINDINGS: Brain: Stable cerebral volume. Remote lacunar infarct within the ventral left thalamus noted. No acute intracranial hemorrhage. No acute large vessel territory infarct. No mass lesion, midline shift or mass effect. No hydrocephalus. No extra-axial fluid collection. Vascular: No hyperdense vessel. Skull: Scalp soft tissues and calvarium within normal limits. Sinuses/Orbits: Globes and orbital soft tissues normal. Mild scattered mucosal thickening throughout the ethmoidal air cells. No mastoid effusion. Other: None. ASPECTS [facility] Stroke Program Early CT Score) - Ganglionic level infarction (caudate, lentiform nuclei, internal capsule, insula, M1-M3 cortex): 7 - Supraganglionic infarction (M4-M6 cortex): 3 Total score (0-10 with 10 being normal): 10 IMPRESSION: 1. No acute intracranial infarct or other process identified. 2. ASPECTS is 10. 3. Remote left thalamic lacunar infarct. Critical Value/emergent results were called by telephone at the time of interpretation on 11/10/2017 at 12:24 am to Dr. Lucrezia Europe , who verbally acknowledged these results. Electronically Signed   By: Rise Mu M.D.   On: 11/10/2017 00:24     Assessment/Plan:  31 y.o. female with hx of suspected stroke who comes into the hospital today unable to speak or move her extremities.  The patient's husband states that they were fixing cars all weekend.  They came home a few hours ago and she was fixing noodles at a neighbors house.  The patient's husband states that he called her over to get some cigarettes and then he went to smoke on the porch.  He reports that he thought she went back over to the neighbor's house but when he called over to the neighbors about an hour later the neighbor states that they had not seen her.  The patient's husband went looking for the patient and after some time found her on the floor in the back yard drooling  but awake. When pt is questioned she is unable to tell me full story and how she ended up here.  Utox positive for cocaine and she states last used about 2 days ago.    - Symptoms are distractible  - She is able to hold her legs up if raised for her  - likely psychosocially driven symptoms as she is homeless and insurance will be in issue regarding follow up [facility] and cervical spine are unremarkable but she does have prior thalamic stroke on [facility] and I am not sure of her prior symptoms - would make sure she is on ASA/Statin  - educated on substance use such as cocaine - I am not convinced she needs and MRI at this time  11/10/2017, 10:34 AM

## 2017-11-10 NOTE — ED Notes (Signed)
Pt on the phone with MRI to answer screening questions.

## 2017-11-10 NOTE — ED Provider Notes (Addendum)
[facility] Emergency Department Provider Note   ____________________________________________   First MD Initiated Contact with Patient 11/10/17 0012     (approximate)  I have reviewed the triage vital signs and the nursing notes.   HISTORY  Chief Complaint Code Stroke  History obtained from patient's husband  HPI Sheri Woods is a 32 y.o. female who comes into the hospital today unable to speak or move her extremities.  The patient's husband states that they were fixing cars all weekend.  They came home a few hours ago and she was fixing noodles at a neighbors house.  The patient's husband states that he called her over to get some cigarettes and then he went to smoke on the porch.  He reports that he thought she went back over to the neighbor's house but when he called over to the neighbors about an hour later the neighbor states that they had not seen her.  The patient's husband went looking for the patient and after some time found her on the floor in the back yard drooling but awake.  He states that she was last seen normal about 10 or 1015.  He found her at around 1110.  The patient has a history of a stroke 4 years ago.  He states that she was fine today although she was tired.  She has not been talking much and she does seem tired.  The patient was brought in for evaluation.   Past Medical History:  Diagnosis Date  . Head trauma   . Headache(784.0)   . Seizures (HCC)   . Stroke [facility])     Patient Active Problem List   Diagnosis Date Noted  . Aphasia 11/10/2017  . CVA (cerebral infarction) 11/16/2012  . Cocaine abuse (HCC) 11/16/2012  . 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 Altered mental status 11/15/2012  . Abnormal CT scan of head 11/15/2012  . Encephalopathy acute 11/15/2012  . 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 Migraine headache 11/15/2012    Past Surgical History:  Procedure Laterality Date  . CESAREAN SECTION     X 4  . TEE WITHOUT  CARDIOVERSION N/A 11/17/2012   Procedure: TRANSESOPHAGEAL ECHOCARDIOGRAM (TEE);  Surgeon: Dolores Patty, MD;  Location: [facility] ENDOSCOPY;  Service: Cardiovascular;  Laterality: N/A;    Prior to Admission medications   Not on File    Allergies Bee venom and Penicillins  History reviewed. No pertinent family history.  Social History Social History   Tobacco Use  . Smoking status: Current Every Day Smoker    Packs/day: 0.50    Years: 15.00    Pack years: 7.50    Types: Cigarettes  . Smokeless tobacco: Never Used  Substance Use Topics  . Alcohol use: No  . Drug use: Yes    Types: Cocaine    Comment: last used within the last month    Review of Systems  Constitutional: No fever/chills Eyes: No visual changes. ENT: No sore throat. Cardiovascular: Denies chest pain. Respiratory: Denies shortness of breath. Gastrointestinal: No abdominal pain.  No nausea, no vomiting.  No diarrhea.  No constipation. Genitourinary: Negative for dysuria. Musculoskeletal: Negative for back pain. Skin: Negative for rash. Neurological: Generalized weakness and a aphasia   ____________________________________________   PHYSICAL EXAM:  VITAL SIGNS: ED Triage Vitals  Enc Vitals Group     BP 11/10/17 0013 (!) 203/134     Pulse Rate 11/10/17 0013 69     Resp 11/10/17 0013 13     Temp 11/10/17  0013 99.1 F (37.3 C)     Temp Source 11/10/17 0013 Oral     SpO2 11/10/17 0013 100 %     Weight 11/10/17 0014 108 lb (49 kg)     Height --      Head Circumference --      Peak Flow --      Pain Score --      Pain Loc --      Pain Edu? --      Excl. in GC? --     Constitutional: Alert unable to assess orientation as patient is unable to speak. Ill appearing and in moderate distress. Eyes: Conjunctivae are normal. PERRL. EOMI. Head: Atraumatic. Nose: No congestion/rhinnorhea. Mouth/Throat: Mucous membranes are moist.  Oropharynx non-erythematous. Cardiovascular: Normal rate, regular rhythm.  Grossly normal heart sounds.  Good peripheral circulation. Respiratory: Normal respiratory effort.  No retractions. Lungs CTAB. Gastrointestinal: Soft and nontender. No distention.  Positive bowel sounds Musculoskeletal: No lower extremity tenderness nor edema.   Neurologic: Patient a phasic and unable to speak, patient unable to smile, she is attempting to speak but does expels saliva and air through her mouth.  The patient is unable to move her upper and lower extremities, she does not withdraw to pain her eyes are open and she is alert. Skin:  Skin is warm, dry and intact.  Psychiatric: Mood and affect are normal.   ____________________________________________   LABS (all labs ordered are listed, but only abnormal results are displayed)  Labs Reviewed  CBC - Abnormal; Notable for the following components:      Result Value   RDW 14.7 (*)    All other components within normal limits  DIFFERENTIAL - Abnormal; Notable for the following components:   Lymphs Abs 3.8 (*)    Monocytes Absolute 1.0 (*)    All other components within normal limits  COMPREHENSIVE METABOLIC PANEL - Abnormal; Notable for the following components:   Potassium 3.3 (*)    All other components within normal limits  URINE DRUG SCREEN, QUALITATIVE ([facility] ONLY) - Abnormal; Notable for the following components:   Cocaine Metabolite,Ur [place] POSITIVE (*)    All other components within normal limits  URINALYSIS, ROUTINE W REFLEX MICROSCOPIC - Abnormal; Notable for the following components:   Color, Urine STRAW (*)    APPearance CLEAR (*)    All other components within normal limits  GLUCOSE, CAPILLARY  ETHANOL  PROTIME-INR  APTT  TROPONIN I  HIV ANTIBODY (ROUTINE TESTING)  CBC  CREATININE, SERUM   ____________________________________________  EKG  ED ECG REPORT I, Rebecka Apley, the attending physician, personally viewed and interpreted this ECG.   Date: 11/10/2017  EKG Time: 0014  Rate: 67  Rhythm:  normal sinus rhythm  Axis: normal  Intervals:none  ST&T Change: none  ____________________________________________  RADIOLOGY  ED MD interpretation:   CT head: No acute intracranial infarct or other process identified, Remote left thalamic lacunar infarct  CT cervical and thoracic spine: Normal CT of the cervical spine and thoracic spine, No acute traumatic injury identified. No significant degenerative. No significant degenerative change or stenosis  Official radiology report(s): Ct Cervical Spine Wo Contrast  Result Date: 11/10/2017 CLINICAL DATA:  Initial evaluation for mid back and thoracic spine pain. EXAM: CT CERVICAL SPINE WITHOUT CONTRAST CT THORACIC SPINE WITHOUT CONTRAST TECHNIQUE: Multidetector CT imaging of the cervical and thoracic spine was performed without contrast. Multiplanar CT image reconstructions were also generated. COMPARISON:  Prior radiograph from 02/07/2014. FINDINGS: CT CERVICAL  SPINE FINDINGS Alignment: Straightening of the normal cervical lordosis. No listhesis. Skull base and vertebrae: Skull base intact. Normal C1-2 articulations are preserved in the dens is intact. Vertebral body heights well maintained. No acute fracture. Soft tissues and spinal canal: Soft tissues of the neck demonstrate no acute abnormality. No abnormal prevertebral edema. Spinal canal within normal limits. Disc levels: No significant degenerative changes within the cervical spine. Upper chest: Visualized upper chest unremarkable. Visualized lungs are grossly clear. Other: None. CT THORACIC SPINE FINDINGS Alignment: Examination technically limited by motion artifact. Vertebral bodies normally aligned with preservation of the normal thoracic kyphosis. No listhesis or malalignment. Vertebrae: Vertebral body heights maintained without evidence for acute or chronic fracture. No discrete lytic or blastic osseous lesions. Paraspinal and other soft tissues: Paraspinous soft tissues demonstrate no acute  abnormality. Partially visualized lungs are grossly clear. Disc levels: No significant degenerative changes seen within the thoracic spine. No appreciable disc protrusion or significant disc bulge. No significant canal or foraminal stenosis. No significant facet degeneration. IMPRESSION: Normal CT of the cervical and thoracic spine. No acute traumatic injury identified. No significant degenerative change or stenosis. Electronically Signed   By: Rise Mu M.D.   On: 11/10/2017 04:05   Ct Thoracic Spine Wo Contrast  Result Date: 11/10/2017 CLINICAL DATA:  Initial evaluation for mid back and thoracic spine pain. EXAM: CT CERVICAL SPINE WITHOUT CONTRAST CT THORACIC SPINE WITHOUT CONTRAST TECHNIQUE: Multidetector CT imaging of the cervical and thoracic spine was performed without contrast. Multiplanar CT image reconstructions were also generated. COMPARISON:  Prior radiograph from 02/07/2014. FINDINGS: CT CERVICAL SPINE FINDINGS Alignment: Straightening of the normal cervical lordosis. No listhesis. Skull base and vertebrae: Skull base intact. Normal C1-2 articulations are preserved in the dens is intact. Vertebral body heights well maintained. No acute fracture. Soft tissues and spinal canal: Soft tissues of the neck demonstrate no acute abnormality. No abnormal prevertebral edema. Spinal canal within normal limits. Disc levels: No significant degenerative changes within the cervical spine. Upper chest: Visualized upper chest unremarkable. Visualized lungs are grossly clear. Other: None. CT THORACIC SPINE FINDINGS Alignment: Examination technically limited by motion artifact. Vertebral bodies normally aligned with preservation of the normal thoracic kyphosis. No listhesis or malalignment. Vertebrae: Vertebral body heights maintained without evidence for acute or chronic fracture. No discrete lytic or blastic osseous lesions. Paraspinal and other soft tissues: Paraspinous soft tissues demonstrate no acute  abnormality. Partially visualized lungs are grossly clear. Disc levels: No significant degenerative changes seen within the thoracic spine. No appreciable disc protrusion or significant disc bulge. No significant canal or foraminal stenosis. No significant facet degeneration. IMPRESSION: Normal CT of the cervical and thoracic spine. No acute traumatic injury identified. No significant degenerative change or stenosis. Electronically Signed   By: Rise Mu M.D.   On: 11/10/2017 04:05   Ct Head Code Stroke Wo Contrast`  Result Date: 11/10/2017 CLINICAL DATA:  Code stroke. Initial evaluation for acute altered mental status. EXAM: CT HEAD WITHOUT CONTRAST TECHNIQUE: Contiguous axial images were obtained from the base of the skull through the vertex without intravenous contrast. COMPARISON:  Prior CT from 10/18/2015. FINDINGS: Brain: Stable cerebral volume. Remote lacunar infarct within the ventral left thalamus noted. No acute intracranial hemorrhage. No acute large vessel territory infarct. No mass lesion, midline shift or mass effect. No hydrocephalus. No extra-axial fluid collection. Vascular: No hyperdense vessel. Skull: Scalp soft tissues and calvarium within normal limits. Sinuses/Orbits: Globes and orbital soft tissues normal. Mild scattered mucosal thickening throughout the ethmoidal  air cells. No mastoid effusion. Other: None. ASPECTS [facility] Stroke Program Early CT Score) - Ganglionic level infarction (caudate, lentiform nuclei, internal capsule, insula, M1-M3 cortex): 7 - Supraganglionic infarction (M4-M6 cortex): 3 Total score (0-10 with 10 being normal): 10 IMPRESSION: 1. No acute intracranial infarct or other process identified. 2. ASPECTS is 10. 3. Remote left thalamic lacunar infarct. Critical Value/emergent results were called by telephone at the time of interpretation on 11/10/2017 at 12:24 am to Dr. Lucrezia Europe , who verbally acknowledged these results. Electronically Signed   By:  Rise Mu M.D.   On: 11/10/2017 00:24    ____________________________________________   PROCEDURES  Procedure(s) performed: None  .Critical Care Performed by: Rebecka Apley, MD Authorized by: Rebecka Apley, MD   Critical care provider statement:    Critical care time (minutes):  30   Critical care start time:  11/10/2017 12:12 AM   Critical care end time:  11/03/2017 12:46 AM   Critical care time was exclusive of:  Separately billable procedures and treating other patients   Critical care was necessary to treat or prevent imminent or life-threatening deterioration of the following conditions:  CNS failure or compromise   Critical care was time spent personally by me on the following activities:  Development of treatment plan with patient or surrogate, discussions with consultants, evaluation of patient's response to treatment, examination of patient, obtaining history from patient or surrogate, ordering and performing treatments and interventions, ordering and review of laboratory studies, ordering and review of radiographic studies, pulse oximetry, re-evaluation of patient's condition and review of old charts   I assumed direction of critical care for this patient from another provider in my specialty: no      Critical Care performed: No  ____________________________________________   INITIAL IMPRESSION / ASSESSMENT AND PLAN / ED COURSE  As part of my medical decision making, I reviewed the following data within the electronic MEDICAL RECORD NUMBER Notes from prior ED visits and [place] Controlled Substance Database   This is a 32 year old female who comes into the hospital today with generalized weakness and a aphasia.  My differential diagnosis includes CVA, intracranial hemorrhage, hypertensive emergency  The patient's blood pressure was severely elevated when she arrived.  We did call a code stroke and take the patient over to CT immediately.  She had a CT and  was evaluated by neurology.  I could not get the patient to perform any of the exams on the [facility].  The patient states that she could not move her any of her extremities nor could she speak.  The patient was seen by neurology and he felt that this was not consistent with a stroke.  The patient had global deficit but was still awake.  He did not recommend TPA but recommended further work-up.  We did check a CBC, CMP, urinalysis, troponin, ethanol, urine drug screen, PT/INR on the patient.  The patient's blood work is unremarkable but she did have some cocaine in her urine.  The patient did have a CT of her head as well as a CT of her thoracic spine and cervical spine.  After a short while she started speaking and stated that she fell down the stairs at home.  She was complaining of some severe mid back pain and states that while she could speak she still could not move her arms well nor her legs.  The patient CT thoracic and cervical spine were both unremarkable.  I did go back into reassess the  patient after giving some fluids and she is able to move her fingers and slightly lift and hold up her arms but she still unable to move her legs.  Given the patient's continued weakness I will admit her to the hospitalist service.  I did give her a dose of hydralazine for her elevated blood pressure.  She will be admitted for further evaluation.       ____________________________________________   FINAL CLINICAL IMPRESSION(S) / ED DIAGNOSES  Final diagnoses:  Aphasia  Weakness  Substance abuse [facility])     ED Discharge Orders    None       Note:  This document was prepared using Dragon voice recognition software and may include unintentional dictation errors.    Rebecka Apley, MD 11/10/17 1610    Rebecka Apley, MD 11/25/17 [phone]

## 2017-11-10 NOTE — Evaluation (Signed)
Occupational Therapy Evaluation Patient Details Name: Sheri Woods MRN: [id number] DOB: 1986-07-05 Today's Date: 11/10/2017    History of Present Illness Pt admitted for aphasia. Currently being worked up for CVA symptoms. HIstory includes previous CVA and seizures.   Clinical Impression   Pt seen for OT evaluation this date. Prior to hospital admission, pt was independent, self reports as homeless but occasionally able to stay with her ex-spouse in a camper. Pt reports she has been unable to work and has numerous falls since previous stroke in 2014. Currently pt demonstrates inconsistent impairments in LUE strength testing and symptoms are somewhat distractible. Pt uses her dominant R hand and teeth to manipulate packets/containers, declining OT's assist for crackers and peanut butter. She performs bed mobility with supervision to CGA to her R side, and min assist for bathing and dressing tasks. Pt reports 8/10 headache and RN provided pain medication during session with no reported relief. Pt reports absent sensation in LLE which has worsened since her 2014 stroke. She also reports impaired sensation in LUE (difficult to assess proprioception). Pt would benefit from skilled OT to address noted impairments and functional limitations (see below for any additional details) in order to maximize safety and independence while minimizing falls risk, caregiver burden, and readmission.     Follow Up Recommendations  Outpatient OT    Equipment Recommendations  None recommended by OT    Recommendations for Other Services       Precautions / Restrictions Precautions Precautions: Fall Restrictions Weight Bearing Restrictions: No      Mobility Bed Mobility Overal bed mobility: Needs Assistance Bed Mobility: Supine to Sit;Sit to Supine     Supine to sit: Min guard Sit to supine: Min guard   General bed mobility comments: transferred to stronger side on R  Transfers     Balance Overall  balance assessment: History of Falls;Needs assistance Sitting-balance support: Feet supported Sitting balance-Leahy Scale: Good                                  ADL either performed or assessed with clinical judgement   ADL Overall ADL's : Needs assistance/impaired Eating/Feeding: Sitting;Modified independent Eating/Feeding Details (indicate cue type and reason): pt uses teeth and R hand to open/tear containers/packages, declines assist from OT stating "I had to learn how to do this after the stroke" Grooming: Sitting;Set up Grooming Details (indicate cue type and reason): using RUE only Upper Body Bathing: Sitting;Minimal assistance   Lower Body Bathing: Sit to/from stand;Minimal assistance   Upper Body Dressing : Sitting;Minimal assistance   Lower Body Dressing: Sit to/from stand;Minimal assistance     Toilet Transfer Details (indicate cue type and reason): did not attempt this session, per RN report pt transferred with 2 RNs to [facility] earlier, will continue to assess                 Vision Patient Visual Report: No change from baseline       Perception     Praxis      Pertinent Vitals/Pain Pain Assessment: 0-10 Pain Score: 8  Pain Location: headache back of head Pain Descriptors / Indicators: Headache Pain Intervention(s): Limited activity within patient's tolerance;Monitored during session;RN gave pain meds during session     Hand Dominance Right   Extremity/Trunk Assessment Upper Extremity Assessment Upper Extremity Assessment: LUE deficits/detail(RUE WFL) LUE Deficits / Details: 2+/5 shoulder flex, elbow flex/ext, wrist flex/ext, and 1/5 finger flex/ext  with concentrated effort; initially does not demonstrate protective instincts when LUE dropped but then does; reports decreased sensation, difficult to assess proprioception LUE Sensation: decreased light touch LUE Coordination: decreased fine motor;decreased gross motor   Lower Extremity  Assessment Lower Extremity Assessment: Defer to PT evaluation;Generalized weakness   Cervical / Trunk Assessment Cervical / Trunk Assessment: Normal   Communication Communication Communication: No difficulties   Cognition Arousal/Alertness: Awake/alert Behavior During Therapy: WFL for tasks assessed/performed Overall Cognitive Status: Within Functional Limits for tasks assessed                                 General Comments: Pt reports having difficulty with STM and reading since previous stroke   General Comments       Exercises Exercises: Other exercises Other Exercises Other Exercises: Pt able to ambulate with reciprocal gait pattern and RW x 40' in room. Safe technique with RW with demonstration given prior to use. Active L UE tricep noted with WBing, still guards L LE. Pt reports she feels stable using AD.   Shoulder Instructions      Home Living Family/patient expects to be discharged to:: Shelter/Homeless(Pt reports she is homeless but sometimes stays with ex-spouse in a camper)                                        Prior Functioning/Environment Level of Independence: Independent        Comments: Pt indep with mobility and ADL, reports she wanted a SPC but didn't have transportation to the doctor to pick it up. Reports due to finances is unable to afford food, medications, etc. and is unable to work since previous stroke. Reports >23 falls in past 12 months.        OT Problem List: Decreased strength;Decreased knowledge of use of DME or AE;Decreased coordination;Impaired UE functional use;Pain;Impaired balance (sitting and/or standing)      OT Treatment/Interventions: Self-care/ADL training;Cognitive remediation/compensation;DME and/or AE instruction;Patient/family education;Neuromuscular education;Therapeutic exercise;Therapeutic activities    OT Goals(Current goals can be found in the care plan section) Acute Rehab OT  Goals Patient Stated Goal: to get stronger OT Goal Formulation: With patient Time For Goal Achievement: 11/24/17 Potential to Achieve Goals: Good ADL Goals Pt Will Perform Lower Body Dressing: with modified independence;sit to/from stand Pt Will Transfer to Toilet: with supervision;ambulating;bedside commode(LRAD for amb)  OT Frequency: Min 1X/week   Barriers to D/C:            Co-evaluation              AM-PAC PT "6 Clicks" Daily Activity     Outcome Measure Help from another person eating meals?: None Help from another person taking care of personal grooming?: A Little Help from another person toileting, which includes using toliet, bedpan, or urinal?: A Little Help from another person bathing (including washing, rinsing, drying)?: A Little Help from another person to put on and taking off regular upper body clothing?: A Little Help from another person to put on and taking off regular lower body clothing?: A Little 6 Click Score: 19   End of Session    Activity Tolerance: Patient tolerated treatment well Patient left: in bed;with call bell/phone within reach;with bed alarm set  OT Visit Diagnosis: Other abnormalities of gait and mobility (R26.89);Repeated falls (R29.6);Pain Pain - part of body: (headache)  Time: 7829-5621 OT Time Calculation (min): 40 min Charges:  OT General Charges $OT Visit: 1 Visit OT Evaluation $OT Eval Low Complexity: 1 Low  Richrd Prime, MPH, MS, OTR/L ascom [phone] 11/10/17, 5:21 PM

## 2017-11-10 NOTE — Progress Notes (Signed)
Chart reviewed. Discussed status with Nsg. Pt. with no speech and language deficits per Nsg and chart. Swallowing WFL and able to feed herself. No ST eval indicated at this time.

## 2017-11-10 NOTE — Plan of Care (Signed)
Dr. Text to request order of BC powder, per patient request, since tylenol did not help the relieve current HA.

## 2017-11-10 NOTE — ED Notes (Signed)
Pt's husband has returned to the room;

## 2017-11-10 NOTE — ED Notes (Addendum)
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 States she has taught herself how to talk again and function again using right side.

## 2017-11-10 NOTE — ED Notes (Addendum)
Pt alert, oriented, speaking and able to move right-sided extremities at this time. Denying pain. Requested bed pan and declined help. Pt able to put self on bed pan.

## 2017-11-10 NOTE — ED Notes (Signed)
telestroke MD and RN have signed off;

## 2017-11-10 NOTE — ED Notes (Signed)
Pt sleeping soundly, even unlabored respirations; oxygen sats 98% on room air; husband asleep on bench in room

## 2017-11-10 NOTE — ED Notes (Signed)
Pt has returned from CT.  

## 2017-11-10 NOTE — ED Notes (Signed)
In with Dr Zenda Alpers to follow up; pt able to left left arm but says she's unable to lift her right arm; lifted pt's arm up and she was able to hold it up on her own; able to wiggle toes but not lift legs

## 2017-11-10 NOTE — ED Notes (Signed)
Pt provided handheld phone to make phone call.

## 2017-11-10 NOTE — H&P (Addendum)
[facility] - [place] [facility]   PATIENT NAME: Sheri Woods    MR#:  [id number]  DATE OF BIRTH:  10/26/1985  DATE OF ADMISSION:  11/10/2017  PRIMARY CARE PHYSICIAN: [facility]   REQUESTING/REFERRING PHYSICIAN: Dr. Zenda Alpers  CHIEF COMPLAINT: Unresponsiveness   Chief Complaint  Patient presents with  . Code Stroke    HISTORY OF PRESENT ILLNESS:  Sheri Woods  is a 32 y.o. female with a known history of previous stroke who presented to hospital because of unable to speak or move extremities.  According to patient has been he phone her on the floor in the backyard drooling but awake.  Patient urine toxicology came positive for cocaine.  Has some headache.  Noted to have some aphasia as per patient but she is talking clearly at this time.  PAST MEDICAL HISTORY:   Past Medical History:  Diagnosis Date  . Head trauma   . Headache(784.0)   . Seizures (HCC)   . Stroke [facility])     PAST SURGICAL HISTOIRY:   Past Surgical History:  Procedure Laterality Date  . CESAREAN SECTION     X 4  . TEE WITHOUT CARDIOVERSION N/A 11/17/2012   Procedure: TRANSESOPHAGEAL ECHOCARDIOGRAM (TEE);  Surgeon: Dolores Patty, MD;  Location: [facility] ENDOSCOPY;  Service: Cardiovascular;  Laterality: N/A;    SOCIAL HISTORY:   Social History   Tobacco Use  . Smoking status: Current Every Day Smoker    Packs/day: 0.50    Years: 15.00    Pack years: 7.50    Types: Cigarettes  . Smokeless tobacco: Never Used  Substance Use Topics  . Alcohol use: No    FAMILY HISTORY:  History reviewed. No pertinent family history.  DRUG ALLERGIES:   Allergies  Allergen Reactions  . Bee Venom Anaphylaxis  . Penicillins Hives and Other (See Comments)    Has patient had a PCN reaction causing immediate rash, facial/tongue/throat swelling, SOB or lightheadedness with hypotension: No Has patient had a PCN reaction causing severe rash involving mucus membranes  or skin necrosis: No Has patient had a PCN reaction that required hospitalization No Has patient had a PCN reaction occurring within the last 10 years: No If all of the above answers are "NO", then may proceed with Cephalosporin use.    REVIEW OF SYSTEMS:  CONSTITUTIONAL: No fever, fatigue or weakness.  EYES: No blurred or double vision.  EARS, NOSE, AND THROAT: No tinnitus or ear pain.  RESPIRATORY: No cough, shortness of breath, wheezing or hemoptysis.  CARDIOVASCULAR: No chest pain, orthopnea, edema.  GASTROINTESTINAL: No nausea, vomiting, diarrhea or abdominal pain.  GENITOURINARY: No dysuria, hematuria.  ENDOCRINE: No polyuria, nocturia,  HEMATOLOGY: No anemia, easy bruising or bleeding SKIN: No rash or lesion. MUSCULOSKELETAL: No joint pain or arthritis.   NEUROLOGIC: Alert, awake, oriented, complains of numbness on the left side lower extremity because of previous stroke. PSYCHIATRY: No anxiety or depression.   MEDICATIONS AT HOME:   Prior to Admission medications   Not on File      VITAL SIGNS:  Blood pressure (!) 143/94, pulse 73, temperature 98.4 F (36.9 C), temperature source Oral, resp. rate 18, height  (1.6 m), weight 42.9 kg (94 lb 8 oz), SpO2 100 %.  PHYSICAL EXAMINATION:  GENERAL:  32 y.o.-year-old patient lying in the bed with no acute distress.  EYES: Pupils equal, round, reactive to light and accommodation. No scleral icterus. Extraocular muscles intact.  HEENT: Head atraumatic, normocephalic. Oropharynx  and nasopharynx clear.  NECK:  Supple, no jugular venous distention. No thyroid enlargement, no tenderness.  LUNGS: Normal breath sounds bilaterally, no wheezing, rales,rhonchi or crepitation. No use of accessory muscles of respiration.  CARDIOVASCULAR: S1, S2 normal. No murmurs, rubs, or gallops.  ABDOMEN: Soft, nontender, nondistended. Bowel sounds present. No organomegaly or mass.  EXTREMITIES: No pedal edema, cyanosis, or clubbing.  NEUROLOGIC:  Patient exam changes, patient says that she is unable to move both legs and left upper extremity.  When she is distracted patient exam is normal patient phone is normal throughout.  No atrophy noted.  plantardowngoing bilaterally. PSYCHIATRIC: The patient is alert and oriented x 3.  SKIN: No obvious rash, lesion, or ulcer.   LABORATORY PANEL:   CBC Recent Labs  Lab 11/10/17 0016  WBC 10.9  HGB 13.2  HCT 39.4  PLT 326   ------------------------------------------------------------------------------------------------------------------  Chemistries  Recent Labs  Lab 11/10/17 0016  NA 137  K 3.3*  CL 104  CO2 23  GLUCOSE 88  BUN 9  CREATININE 0.68  CALCIUM 9.0  AST 28  ALT 19  ALKPHOS 39  BILITOT 0.5   ------------------------------------------------------------------------------------------------------------------  Cardiac Enzymes Recent Labs  Lab 11/10/17 0016  TROPONINI <0.03   ------------------------------------------------------------------------------------------------------------------  RADIOLOGY:  Ct Cervical Spine Wo Contrast  Result Date: 11/10/2017 CLINICAL DATA:  Initial evaluation for mid back and thoracic spine pain. EXAM: CT CERVICAL SPINE WITHOUT CONTRAST CT THORACIC SPINE WITHOUT CONTRAST TECHNIQUE: Multidetector CT imaging of the cervical and thoracic spine was performed without contrast. Multiplanar CT image reconstructions were also generated. COMPARISON:  Prior radiograph from 02/07/2014. FINDINGS: CT CERVICAL SPINE FINDINGS Alignment: Straightening of the normal cervical lordosis. No listhesis. Skull base and vertebrae: Skull base intact. Normal C1-2 articulations are preserved in the dens is intact. Vertebral body heights well maintained. No acute fracture. Soft tissues and spinal canal: Soft tissues of the neck demonstrate no acute abnormality. No abnormal prevertebral edema. Spinal canal within normal limits. Disc levels: No significant degenerative  changes within the cervical spine. Upper chest: Visualized upper chest unremarkable. Visualized lungs are grossly clear. Other: None. CT THORACIC SPINE FINDINGS Alignment: Examination technically limited by motion artifact. Vertebral bodies normally aligned with preservation of the normal thoracic kyphosis. No listhesis or malalignment. Vertebrae: Vertebral body heights maintained without evidence for acute or chronic fracture. No discrete lytic or blastic osseous lesions. Paraspinal and other soft tissues: Paraspinous soft tissues demonstrate no acute abnormality. Partially visualized lungs are grossly clear. Disc levels: No significant degenerative changes seen within the thoracic spine. No appreciable disc protrusion or significant disc bulge. No significant canal or foraminal stenosis. No significant facet degeneration. IMPRESSION: Normal CT of the cervical and thoracic spine. No acute traumatic injury identified. No significant degenerative change or stenosis. Electronically Signed   By: Rise Mu M.D.   On: 11/10/2017 04:05   Ct Thoracic Spine Wo Contrast  Result Date: 11/10/2017 CLINICAL DATA:  Initial evaluation for mid back and thoracic spine pain. EXAM: CT CERVICAL SPINE WITHOUT CONTRAST CT THORACIC SPINE WITHOUT CONTRAST TECHNIQUE: Multidetector CT imaging of the cervical and thoracic spine was performed without contrast. Multiplanar CT image reconstructions were also generated. COMPARISON:  Prior radiograph from 02/07/2014. FINDINGS: CT CERVICAL SPINE FINDINGS Alignment: Straightening of the normal cervical lordosis. No listhesis. Skull base and vertebrae: Skull base intact. Normal C1-2 articulations are preserved in the dens is intact. Vertebral body heights well maintained. No acute fracture. Soft tissues and spinal canal: Soft tissues of the neck demonstrate no acute  abnormality. No abnormal prevertebral edema. Spinal canal within normal limits. Disc levels: No significant degenerative  changes within the cervical spine. Upper chest: Visualized upper chest unremarkable. Visualized lungs are grossly clear. Other: None. CT THORACIC SPINE FINDINGS Alignment: Examination technically limited by motion artifact. Vertebral bodies normally aligned with preservation of the normal thoracic kyphosis. No listhesis or malalignment. Vertebrae: Vertebral body heights maintained without evidence for acute or chronic fracture. No discrete lytic or blastic osseous lesions. Paraspinal and other soft tissues: Paraspinous soft tissues demonstrate no acute abnormality. Partially visualized lungs are grossly clear. Disc levels: No significant degenerative changes seen within the thoracic spine. No appreciable disc protrusion or significant disc bulge. No significant canal or foraminal stenosis. No significant facet degeneration. IMPRESSION: Normal CT of the cervical and thoracic spine. No acute traumatic injury identified. No significant degenerative change or stenosis. Electronically Signed   By: Rise Mu M.D.   On: 11/10/2017 04:05   Ct Head Code Stroke Wo Contrast`  Result Date: 11/10/2017 CLINICAL DATA:  Code stroke. Initial evaluation for acute altered mental status. EXAM: CT HEAD WITHOUT CONTRAST TECHNIQUE: Contiguous axial images were obtained from the base of the skull through the vertex without intravenous contrast. COMPARISON:  Prior CT from 10/18/2015. FINDINGS: Brain: Stable cerebral volume. Remote lacunar infarct within the ventral left thalamus noted. No acute intracranial hemorrhage. No acute large vessel territory infarct. No mass lesion, midline shift or mass effect. No hydrocephalus. No extra-axial fluid collection. Vascular: No hyperdense vessel. Skull: Scalp soft tissues and calvarium within normal limits. Sinuses/Orbits: Globes and orbital soft tissues normal. Mild scattered mucosal thickening throughout the ethmoidal air cells. No mastoid effusion. Other: None. ASPECTS [facility]  Stroke Program Early CT Score) - Ganglionic level infarction (caudate, lentiform nuclei, internal capsule, insula, M1-M3 cortex): 7 - Supraganglionic infarction (M4-M6 cortex): 3 Total score (0-10 with 10 being normal): 10 IMPRESSION: 1. No acute intracranial infarct or other process identified. 2. ASPECTS is 10. 3. Remote left thalamic lacunar infarct. Critical Value/emergent results were called by telephone at the time of interpretation on 11/10/2017 at 12:24 am to Dr. Lucrezia Europe , who verbally acknowledged these results. Electronically Signed   By: Rise Mu M.D.   On: 11/10/2017 00:24    EKG:   Orders placed or performed during the hospital encounter of 11/10/17  . ED EKG  . ED EKG    IMPRESSION AND PLAN:  32 year old female with suspected stroke, history of previous stroke on the left side comes with unable to move,, aphasia.  Patient has history of tobacco abuse, urine toxicology positive for cocaine.  She says she drank alcohol over the weekend and used cocaine when she was alcohol she denies using cocaine on a regular basis. Unresponsiveness, episode of aphasia, generalized weakness, telemetry neurology did not think it is acute stroke.  CT head unremarkable.  CT cervical spine was done in the emergency room which is unremarkable.  Neurologist does not think she needs any further inpatient neurological work-up.  Get physical therapy evaluation, continue aspirin and statins, follow full stroke work-up.  Discussed with Dr. Loretha Brasil neurologist who thinks her symptoms are psycho somatic.  After the stroke work-up has been negative likely discharge home tomorrow.  Continue Tylenol for headache.  Start the diet.  Monitor on telemetry.  #2 malignant hypertension.  Patient supposed to be on lisinopril 20 mg daily for blood pressure as per neurology records.  She started lisinopril. 3.  Depression: Continue Zoloft 100 mg daily.  History of previous stroke  with left leg numbness,  follows up with Dr. Malvin Johns from neurology.\ NOTe;patient being evaluated for seizure/pseudoseizure as an outpatient.  All the records are reviewed and case discussed with ED provider. Management plans discussed with the patient, family and they are in agreement.  CODE STATUS: full  TOTAL TIME TAKING CARE OF THIS PATIENT: .    Katha Hamming M.D on 11/10/2017 at 11:32 AM  Between 7am to 6pm - Pager - [phone]  After 6pm go to [email or web address] - password [email or web address] [facility]  [facility]  Office  [phone]  CC: Primary care physician; [facility]  Note: This dictation was prepared with Nurse, children's dictation along with smaller phrase technology. Any transcriptional errors that result from this process are unintentional.

## 2017-11-10 NOTE — Clinical Social Work Note (Addendum)
Clinical Social Work Assessment  Patient Details  Name: Sheri Woods MRN: [id number] Date of Birth: October 17, 1985  Date of referral:  11/10/17               Reason for consult:  Housing Concerns/Homelessness                Permission sought to share information with:    Permission granted to share information::     Name::        Agency::     Relationship::     Contact Information:     Housing/Transportation Living arrangements for the past 2 months:  No permanent address, Homeless Source of Information:  Patient Patient Interpreter Needed:  None Criminal Activity/Legal Involvement Pertinent to Current Situation/Hospitalization:  No - Comment as needed Significant Relationships:  None Lives with:  Self Do you feel safe going back to the place where you live?  Yes Need for family participation in patient care:     Care giving concerns:  Patient states she is homeless and lives on the street in [place]. She states that occasionally she will stay with her husband in a camper but prefers not to do that.    Social Worker assessment / plan:  Holiday representative (CSW) received a consult for patient due to her being homeless. CSW met with patient and she states that she is homeless and lives on the street. Patient reports that she does not use drugs or alcohol. Patient also reports that she has 5 children that she does not have custody of currently. Her children are all living with other family members because patient is homeless. CSW gave patient a list of resources in [facility] including [employer]. Patient stated that she has already tried to go to [employer] and they would not accept her because she did not have any kids with her. Patient was very guarded and did not want to discuss her current condition. CSW asked patient what her discharge plan is and patient reported that she will return to the street. She has no other plans at this time.   Employment status:   Unemployed Forensic scientist:  Self Pay (Medicaid Pending) PT Recommendations:  Not assessed at this time Information / Referral to community resources:  Shelter  Patient/Family's Response to care:  Patient has accepted that she is homeless.   Patient/Family's Understanding of and Emotional Response to Diagnosis, Current Treatment, and Prognosis:  Patient reports that she does not have any family supports. Patient is indifferent toward CSW when discussing current situation and any assistance.   Emotional Assessment Appearance:  Disheveled Attitude/Demeanor/Rapport:  Guarded Affect (typically observed):  Quiet, Withdrawn Orientation:  Oriented to Self, Oriented to Place, Oriented to  Time, Oriented to Situation Alcohol / Substance use:  Never Used Psych involvement (Current and /or in the community):  No (Comment)  Discharge Needs  Concerns to be addressed:  Homelessness Readmission within the last 30 days:  No Current discharge risk:  Homeless Barriers to Discharge:  Continued Medical Work up   [employer], [place] 11/10/2017, 3:44 PM

## 2017-11-10 NOTE — ED Notes (Signed)
Pt has gone to CT

## 2017-11-10 NOTE — Plan of Care (Signed)
Upon admission - NIH attempted, revealing severe weakness and near flacid bilat legs and left UE.  However, at 1245pm , patient was assisted by myself and Meghan, RN to the [facility] (per patient's request.). Patient was able to place weight on both LE and shuffle to [facility] while following instructions.  She then reached back with both hands and lowered herself to the [facility]. Both hands/arms revealed purposeful movements while voiding and cleaning herself up.  Patient then returned to the bed in the same manner and even placed both legs back on the bed (without assistance).    RN assessment revealed possible weakness, greater than baseline.  However, motor skills appear intact. Patient also passed swallow screen in ED and is able to feed herself.

## 2017-11-10 NOTE — Progress Notes (Signed)
Anticoagulation monitoring(Lovenox):  32yo  female ordered Lovenox 40 mg Q24h for DVT prevention,  Filed Weights   11/10/17 0014 11/10/17 0015 11/10/17 0918  Weight: 108 lb (49 kg) 98 lb (44.5 kg) 94 lb 8 oz (42.9 kg)     Lab Results  Component Value Date   CREATININE 0.68 11/10/2017   CREATININE 0.63 10/18/2015   CREATININE 0.70 11/15/2012   Estimated Creatinine Clearance: 67.1 mL/min (by C-G formula based on SCr of 0.68 mg/dL). Hemoglobin & Hematocrit     Component Value Date/Time   HGB 13.2 11/10/2017 0016   HGB 12.0 09/24/2012 1239   HCT 39.4 11/10/2017 0016   HCT 36.9 09/24/2012 1239     Per Protocol for Patient with weight < 45kg, will transition to Lovenox 30 mg Q24h.     Clovia Cuff, PharmD, BCPS 11/10/2017 10:20 AM

## 2017-11-11 LAB — LIPID PANEL
CHOLESTEROL: 127 mg/dL (ref 0–200)
HDL: 58 mg/dL (ref >40)
LDL Cholesterol: 62 mg/dL (ref 0–99)
Total CHOL/HDL Ratio: 2.2 RATIO
Triglycerides: 37 mg/dL (ref <150)
VLDL: 7 mg/dL (ref 0–40)

## 2017-11-11 LAB — HEMOGLOBIN A1C
Hgb A1c MFr Bld: 5.3 % (ref 4.8–5.6)
Mean Plasma Glucose: 105.41 mg/dL

## 2017-11-11 LAB — HIV ANTIBODY (ROUTINE TESTING W REFLEX): HIV Screen 4th Generation wRfx: NONREACTIVE

## 2017-11-11 MED ORDER — ASPIRIN 81 MG PO CHEW: 81.0000 mg | CHEWABLE_TABLET | Freq: Every day | ORAL | 0 refills | Status: DC

## 2017-11-11 MED ORDER — LISINOPRIL 10 MG PO TABS: 10.0000 mg | ORAL_TABLET | Freq: Every day | ORAL | 0 refills | Status: DC

## 2017-11-11 MED ORDER — SERTRALINE HCL 50 MG PO TABS: 50.0000 mg | ORAL_TABLET | Freq: Every day | ORAL | 0 refills | Status: DC

## 2017-11-11 NOTE — Discharge Summary (Signed)
Sheri Woods, is a 32 y.o. female  DOB Feb 22, 1986  MRN [id number].  Admission date:  11/10/2017  Admitting Physician  Katha Hamming, MD  Discharge Date:  11/11/2017   Primary MD  Center, [facility]  Recommendations for primary care physician for things to follow:  Discharge home, follow-up with Dr. Malvin Johns as an outpatient.   Admission Diagnosis  Aphasia [R47.01] Substance abuse (HCC) [F19.10] Weakness [R53.1]   Discharge Diagnosis  Aphasia [R47.01] Substance abuse (HCC) [F19.10] Weakness [R53.1]    Active Problems:   Aphasia      Past Medical History:  Diagnosis Date  . Head trauma   . Headache(784.0)   . Seizures (HCC)   . Stroke [facility])     Past Surgical History:  Procedure Laterality Date  . CESAREAN SECTION     X 4  . TEE WITHOUT CARDIOVERSION N/A 11/17/2012   Procedure: TRANSESOPHAGEAL ECHOCARDIOGRAM (TEE);  Surgeon: Dolores Patty, MD;  Location: [facility] ENDOSCOPY;  Service: Cardiovascular;  Laterality: N/A;       History of present illness and  Hospital Course:     Kindly see H&P for history of present illness and admission details, please review complete Labs, Consult reports and Test reports for all details in brief  HPI  from the history and physical done on the day of admission 32 year old female patient admitted because of episode of aphasia, unresponsiveness, evaluation for TIA.  Please look at H&P for full details.   Hospital Course  #1 unresponsiveness, aphasia likely secondary to polysubstance abuse rather than acute stroke.  Patient CT head showed old thalamic stroke, MRI of the brain did not show any acute stroke.  Ultrasound of carotids did not show any hemodynamically significant stenosis.  Speech therapy did not recommend any speech services are dietary changes.   Seen by physical therapy recommended outpatient physical therapy, outpatient occupational therapy.  Patient MRI of the brain showed remote left thalamic infarct.  No acute finding.  Seen by Dr. Loretha Brasil from neurology, patient initially complained of lower leg weakness and unable to move but after distraction patient had good strength in both extremities patient had known weakness on the left side due to previous stroke but she is able to ambulate without walker.  Advised the patient to quit smoking and also using illicit substances. e LDL is 62. #2 mild hypokalemia potassium 3.3. 3.  History of depression; she takes Zoloft as per Dr. Hale Bogus from neurology note.  Patient sees Dr. Chevis Pretty as an outpatient regarding her previous stroke and follow-up. 4.  Essential hypertension: BP high when she came received home dose lisinopril, patient blood pressure better, BP 103/66.  Patient had a hunger headache yesterday and BP improved after Tylenol for headache.  Discharge Condition: stable  Follow UP      Discharge Instructions  and  Discharge Medications     Allergies as of 11/11/2017      Reactions   Bee Venom Anaphylaxis   Penicillins Hives, Other (See Comments)   Has patient had a PCN reaction causing immediate rash, facial/tongue/throat swelling, SOB or lightheadedness with hypotension: No Has patient had a PCN reaction causing severe rash involving mucus membranes or skin necrosis: No Has patient had a PCN reaction that required hospitalization No Has patient had a PCN reaction occurring within the last 10 years: No If all of the above answers are "NO", then may proceed with Cephalosporin use.      Medication List    TAKE these medications  aspirin 81 MG chewable tablet Chew 1 tablet (81 mg total) by mouth daily.   lisinopril 10 MG tablet Commonly known as:  PRINIVIL,ZESTRIL Take 1 tablet (10 mg total) by mouth daily.   sertraline 50 MG tablet Commonly known as:  ZOLOFT Take 1  tablet (50 mg total) by mouth at bedtime.         Diet and Activity recommendation: See Discharge Instructions above   Consults obtained -neurology, physical therapy, provisional therapy, speech therapy   Major procedures and Radiology Reports - PLEASE review detailed and final reports for all details, in brief -      Ct Cervical Spine Wo Contrast  Result Date: 11/10/2017 CLINICAL DATA:  Initial evaluation for mid back and thoracic spine pain. EXAM: CT CERVICAL SPINE WITHOUT CONTRAST CT THORACIC SPINE WITHOUT CONTRAST TECHNIQUE: Multidetector CT imaging of the cervical and thoracic spine was performed without contrast. Multiplanar CT image reconstructions were also generated. COMPARISON:  Prior radiograph from 02/07/2014. FINDINGS: CT CERVICAL SPINE FINDINGS Alignment: Straightening of the normal cervical lordosis. No listhesis. Skull base and vertebrae: Skull base intact. Normal C1-2 articulations are preserved in the dens is intact. Vertebral body heights well maintained. No acute fracture. Soft tissues and spinal canal: Soft tissues of the neck demonstrate no acute abnormality. No abnormal prevertebral edema. Spinal canal within normal limits. Disc levels: No significant degenerative changes within the cervical spine. Upper chest: Visualized upper chest unremarkable. Visualized lungs are grossly clear. Other: None. CT THORACIC SPINE FINDINGS Alignment: Examination technically limited by motion artifact. Vertebral bodies normally aligned with preservation of the normal thoracic kyphosis. No listhesis or malalignment. Vertebrae: Vertebral body heights maintained without evidence for acute or chronic fracture. No discrete lytic or blastic osseous lesions. Paraspinal and other soft tissues: Paraspinous soft tissues demonstrate no acute abnormality. Partially visualized lungs are grossly clear. Disc levels: No significant degenerative changes seen within the thoracic spine. No appreciable disc  protrusion or significant disc bulge. No significant canal or foraminal stenosis. No significant facet degeneration. IMPRESSION: Normal CT of the cervical and thoracic spine. No acute traumatic injury identified. No significant degenerative change or stenosis. Electronically Signed   By: Rise Mu M.D.   On: 11/10/2017 04:05   Ct Thoracic Spine Wo Contrast  Result Date: 11/10/2017 CLINICAL DATA:  Initial evaluation for mid back and thoracic spine pain. EXAM: CT CERVICAL SPINE WITHOUT CONTRAST CT THORACIC SPINE WITHOUT CONTRAST TECHNIQUE: Multidetector CT imaging of the cervical and thoracic spine was performed without contrast. Multiplanar CT image reconstructions were also generated. COMPARISON:  Prior radiograph from 02/07/2014. FINDINGS: CT CERVICAL SPINE FINDINGS Alignment: Straightening of the normal cervical lordosis. No listhesis. Skull base and vertebrae: Skull base intact. Normal C1-2 articulations are preserved in the dens is intact. Vertebral body heights well maintained. No acute fracture. Soft tissues and spinal canal: Soft tissues of the neck demonstrate no acute abnormality. No abnormal prevertebral edema. Spinal canal within normal limits. Disc levels: No significant degenerative changes within the cervical spine. Upper chest: Visualized upper chest unremarkable. Visualized lungs are grossly clear. Other: None. CT THORACIC SPINE FINDINGS Alignment: Examination technically limited by motion artifact. Vertebral bodies normally aligned with preservation of the normal thoracic kyphosis. No listhesis or malalignment. Vertebrae: Vertebral body heights maintained without evidence for acute or chronic fracture. No discrete lytic or blastic osseous lesions. Paraspinal and other soft tissues: Paraspinous soft tissues demonstrate no acute abnormality. Partially visualized lungs are grossly clear. Disc levels: No significant degenerative changes seen within the thoracic spine. No  appreciable disc  protrusion or significant disc bulge. No significant canal or foraminal stenosis. No significant facet degeneration. IMPRESSION: Normal CT of the cervical and thoracic spine. No acute traumatic injury identified. No significant degenerative change or stenosis. Electronically Signed   By: Rise Mu M.D.   On: 11/10/2017 04:05   Mr Brain Wo Contrast  Result Date: 11/10/2017 CLINICAL DATA:  Aphasia and speech difficulty. History of cocaine use. EXAM: MRI HEAD WITHOUT CONTRAST MRA HEAD WITHOUT CONTRAST TECHNIQUE: Multiplanar, multiecho pulse sequences of the brain and surrounding structures were obtained without intravenous contrast. Angiographic images of the head were obtained using MRA technique without contrast. COMPARISON:  Head CT from earlier today FINDINGS: MRI HEAD FINDINGS Brain: No acute infarction, hemorrhage, hydrocephalus, extra-axial collection or mass lesion. Remote lacunar type infarct in the left thalamus. Few FLAIR hyperintense foci in the cerebral white matter. Vascular: Arterial findings below. Normal dural venous sinus flow voids. 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 Codominant vertebral arteries. Dominant right PICA and left AICA. No branch occlusion, beading, or stenosis. Negative for aneurysm. IMPRESSION: 1. No acute finding. 2. Remote left thalamus infarct. 3. 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 Electronically Signed   By: Marnee Spring M.D.   On: 11/10/2017 12:42   US Carotid Bilateral (at [facility] Only)  Result Date: 11/10/2017 CLINICAL DATA:  Aphasia and syncope. EXAM: BILATERAL CAROTID DUPLEX ULTRASOUND TECHNIQUE: Wallace Cullens scale imaging, color Doppler and duplex ultrasound were performed of bilateral carotid and vertebral arteries in the neck. COMPARISON:  None. FINDINGS: Criteria: Quantification of carotid  stenosis is based on velocity parameters that correlate the residual internal carotid diameter with NASCET-based stenosis levels, using the diameter of the distal internal carotid lumen as the denominator for stenosis measurement. The following velocity measurements were obtained: RIGHT ICA:  83/39 cm/sec CCA:  110/48 cm/sec SYSTOLIC ICA/CCA RATIO:  0.8 ECA:  83 cm/sec LEFT ICA:  79/44 cm/sec CCA:  102/49 cm/sec SYSTOLIC ICA/CCA RATIO:  0.8 ECA:  82 cm/sec RIGHT CAROTID ARTERY: No evidence of focal plaque or carotid stenosis in the neck. Normal velocities and arterial waveforms. RIGHT VERTEBRAL ARTERY: Antegrade flow with normal waveform and velocity. LEFT CAROTID ARTERY: No evidence of focal plaque or carotid stenosis in the neck. Normal velocities and arterial waveforms. LEFT VERTEBRAL ARTERY: Antegrade flow with normal waveform and velocity. IMPRESSION: Normal bilateral carotid duplex ultrasound demonstrating no evidence of carotid stenosis or focal plaque. Bilateral vertebral arteries demonstrate antegrade flow in the neck. Electronically Signed   By: Irish Lack M.D.   On: 11/10/2017 11:38   Mr Maxine Glenn Head/brain [facility] Cm  Result Date: 11/10/2017 CLINICAL DATA:  Aphasia and speech difficulty. History of cocaine use. EXAM: MRI HEAD WITHOUT CONTRAST MRA HEAD WITHOUT CONTRAST TECHNIQUE: Multiplanar, multiecho pulse sequences of the brain and surrounding structures were obtained without intravenous contrast. Angiographic images of the head were obtained using MRA technique without contrast. COMPARISON:  Head CT from earlier today FINDINGS: MRI HEAD FINDINGS Brain: No acute infarction, hemorrhage, hydrocephalus, extra-axial collection or mass lesion. Remote lacunar type infarct in the left thalamus. Few FLAIR hyperintense foci in the cerebral white matter. Vascular: Arterial findings below. Normal dural venous sinus flow voids. Skull and upper cervical spine: Negative Sinuses/Orbits: Negative MRA HEAD FINDINGS  Asymmetric small right ICA in the setting of right A1 hypoplasia and fetal type left PCA. Codominant vertebral arteries. Dominant right PICA  and left AICA. No branch occlusion, beading, or stenosis. Negative for aneurysm. IMPRESSION: 1. No acute finding. 2. Remote left thalamus infarct. 3. 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 Electronically Signed   By: Marnee Spring M.D.   On: 11/10/2017 12:42   Ct Head Code Stroke Wo Contrast`  Result Date: 11/10/2017 CLINICAL DATA:  Code stroke. Initial evaluation for acute altered mental status. EXAM: CT HEAD WITHOUT CONTRAST TECHNIQUE: Contiguous axial images were obtained from the base of the skull through the vertex without intravenous contrast. COMPARISON:  Prior CT from 10/18/2015. FINDINGS: Brain: Stable cerebral volume. Remote lacunar infarct within the ventral left thalamus noted. No acute intracranial hemorrhage. No acute large vessel territory infarct. No mass lesion, midline shift or mass effect. No hydrocephalus. No extra-axial fluid collection. Vascular: No hyperdense vessel. Skull: Scalp soft tissues and calvarium within normal limits. Sinuses/Orbits: Globes and orbital soft tissues normal. Mild scattered mucosal thickening throughout the ethmoidal air cells. No mastoid effusion. Other: None. ASPECTS [facility] Stroke Program Early CT Score) - Ganglionic level infarction (caudate, lentiform nuclei, internal capsule, insula, M1-M3 cortex): 7 - Supraganglionic infarction (M4-M6 cortex): 3 Total score (0-10 with 10 being normal): 10 IMPRESSION: 1. No acute intracranial infarct or other process identified. 2. ASPECTS is 10. 3. Remote left thalamic lacunar infarct. Critical Value/emergent results were called by telephone at the time of interpretation on 11/10/2017 at 12:24 am to Dr. Lucrezia Europe , who verbally acknowledged these results. Electronically Signed   By: Rise Mu M.D.   On: 11/10/2017 00:24    Micro Results      No results found for this or any previous visit (from the past 240 hour(s)).     Today   Subjective:   Sheri Woods today has no headache,no chest abdominal pain,no new weakness tingling or numbness, feels much better wants to go home today.   Objective:   Blood pressure 103/66, pulse 69, temperature 98.4 F (36.9 C), temperature source Oral, resp. rate 16, height  (1.6 m), weight 42.9 kg (94 lb 8 oz), SpO2 98 %.   Intake/Output Summary (Last 24 hours) at 11/11/2017 0844 Last data filed at 11/10/2017 1555 Gross per 24 hour  Intake 240 ml  Output -  Net 240 ml    Exam Awake Alert, Oriented x 3, No new F.N deficits, Normal affect [employer].AT,PERRAL Supple Neck,No JVD, No cervical lymphadenopathy appriciated.  Symmetrical Chest wall movement, Good air movement bilaterally, CTAB RRR,No Gallops,Rubs or new Murmurs, No Parasternal Heave +ve B.Sounds, Abd Soft, Non tender, No organomegaly appriciated, No rebound -guarding or rigidity. No Cyanosis, Clubbing or edema, No new Rash or bruise  Data Review   CBC w Diff:  Lab Results  Component Value Date   WBC 10.9 11/10/2017   HGB 13.2 11/10/2017   HGB 12.0 09/24/2012   HCT 39.4 11/10/2017   HCT 36.9 09/24/2012   PLT 326 11/10/2017   PLT 351 09/24/2012   LYMPHOPCT 34 11/10/2017   MONOPCT 9 11/10/2017   EOSPCT 1 11/10/2017   BASOPCT 1 11/10/2017    CMP:  Lab Results  Component Value Date   NA 137 11/10/2017   NA 137 09/24/2012   K 3.3 (L) 11/10/2017   K 3.9 09/24/2012   CL 104 11/10/2017   CL 107 09/24/2012   CO2 23 11/10/2017   CO2 25 09/24/2012   BUN 9 11/10/2017   BUN 8 09/24/2012   CREATININE 0.68 11/10/2017   CREATININE 0.59 (L) 09/24/2012   PROT 7.8 11/10/2017  PROT 8.3 (H) 09/24/2012   ALBUMIN 4.3 11/10/2017   ALBUMIN 4.1 09/24/2012   BILITOT 0.5 11/10/2017   BILITOT 0.3 09/24/2012   ALKPHOS 39 11/10/2017   ALKPHOS 54 09/24/2012   AST 28 11/10/2017   AST 29 09/24/2012   ALT 19 11/10/2017    ALT 27 09/24/2012  .   Total Time in preparing paper work, data evaluation and todays exam - 35 minutes  Katha Hamming M.D on 11/11/2017 at 8:44 AM    Note: This dictation was prepared with Dragon dictation along with smaller phrase technology. Any transcriptional errors that result from this process are unintentional.

## 2017-11-11 NOTE — Progress Notes (Signed)
Pt for discharge home alert no resp distress. Sl d/cd. Instructions for discharge discussed. Meds/diet/ activity and f/u discussed. Pt/ot to see pt at home. Understands discharge plans

## 2017-11-11 NOTE — Plan of Care (Signed)
  Problem: Education: Goal: Knowledge of General Education information will improve Outcome: Progressing   Problem: Health Behavior/Discharge Planning: Goal: Ability to manage health-related needs will improve Outcome: Progressing   Problem: Nutrition: Goal: Adequate nutrition will be maintained Outcome: Progressing   Problem: Coping: Goal: Level of anxiety will decrease Outcome: Progressing   Problem: Safety: Goal: Ability to remain free from injury will improve Outcome: Progressing   Problem: Education: Goal: Knowledge of disease or condition will improve Outcome: Progressing Goal: Knowledge of secondary prevention will improve Outcome: Progressing Goal: Knowledge of patient specific risk factors addressed and post discharge goals established will improve Outcome: Progressing

## 2017-11-11 NOTE — Plan of Care (Signed)
  Problem: Education: Goal: Knowledge of General Education information will improve Outcome: Progressing   Problem: Health Behavior/Discharge Planning: Goal: Ability to manage health-related needs will improve Outcome: Progressing   Problem: Clinical Measurements: Goal: Ability to maintain clinical measurements within normal limits will improve Outcome: Progressing Goal: Will remain free from infection Outcome: Progressing Goal: Diagnostic test results will improve Outcome: Progressing Goal: Respiratory complications will improve Outcome: Progressing Goal: Cardiovascular complication will be avoided Outcome: Progressing   Problem: Activity: Goal: Risk for activity intolerance will decrease Outcome: Progressing   Problem: Nutrition: Goal: Adequate nutrition will be maintained Outcome: Progressing   Problem: Coping: Goal: Level of anxiety will decrease Outcome: Progressing   Problem: Elimination: Goal: Will not experience complications related to bowel motility Outcome: Progressing Goal: Will not experience complications related to urinary retention Outcome: Progressing   Problem: Pain Managment: Goal: General experience of comfort will improve Outcome: Progressing   Problem: Safety: Goal: Ability to remain free from injury will improve Outcome: Progressing   Problem: Skin Integrity: Goal: Risk for impaired skin integrity will decrease Outcome: Progressing   Problem: Education: Goal: Knowledge of disease or condition will improve Outcome: Progressing Goal: Knowledge of secondary prevention will improve Outcome: Progressing Goal: Knowledge of patient specific risk factors addressed and post discharge goals established will improve Outcome: Progressing   Problem: Coping: Goal: Will verbalize positive feelings about self Outcome: Progressing   Problem: Health Behavior/Discharge Planning: Goal: Ability to manage health-related needs will improve Outcome:  Progressing   Problem: Self-Care: Goal: Ability to participate in self-care as condition permits will improve Outcome: Progressing Goal: Verbalization of feelings and concerns over difficulty with self-care will improve Outcome: Progressing Goal: Ability to communicate needs accurately will improve Outcome: Progressing   Problem: Nutrition: Goal: Risk of aspiration will decrease Outcome: Progressing Goal: Dietary intake will improve Outcome: Progressing   

## 2017-11-11 NOTE — Progress Notes (Signed)
Patient refused bed alarm. Patient requested supplies for self clean-up with help from visitor. Patient was educated about safety and preventing falls. Patient stated understanding.

## 2017-11-11 NOTE — Discharge Instructions (Signed)
Patient needs outpatient physical therapy, occupational therapy.

## 2017-11-11 NOTE — Care Management (Signed)
Requested rolling walker for left sided weakness. Will request DME order from Dr, Luberta Mutter. Feliberto Gottron, [facility] representative updated Discharge to home today per Dr. Bonita Quin RN MSN CCM Care Management [phone]

## 2018-01-28 ENCOUNTER — Emergency Department: Payer: Medicaid Other

## 2018-01-28 ENCOUNTER — Encounter: Payer: Self-pay | Admitting: Emergency Medicine

## 2018-01-28 ENCOUNTER — Emergency Department
Admission: EM | Admit: 2018-01-28 | Discharge: 2018-01-28 | Disposition: A | Discharge status: Home / Self Care | Payer: Medicaid Other | Attending: Emergency Medicine | Admitting: Emergency Medicine

## 2018-01-28 DIAGNOSIS — Y999 Unspecified external cause status: Secondary | ICD-10-CM | POA: Diagnosis not present

## 2018-01-28 DIAGNOSIS — Y929 Unspecified place or not applicable: Secondary | ICD-10-CM | POA: Insufficient documentation

## 2018-01-28 DIAGNOSIS — W1842XA Slipping, tripping and stumbling without falling due to stepping into hole or opening, initial encounter: Secondary | ICD-10-CM | POA: Diagnosis not present

## 2018-01-28 DIAGNOSIS — S93602A Unspecified sprain of left foot, initial encounter: Secondary | ICD-10-CM | POA: Diagnosis not present

## 2018-01-28 DIAGNOSIS — Z7982 Long term (current) use of aspirin: Secondary | ICD-10-CM | POA: Diagnosis not present

## 2018-01-28 DIAGNOSIS — Y939 Activity, unspecified: Secondary | ICD-10-CM | POA: Insufficient documentation

## 2018-01-28 DIAGNOSIS — F17210 Nicotine dependence, cigarettes, uncomplicated: Secondary | ICD-10-CM | POA: Insufficient documentation

## 2018-01-28 DIAGNOSIS — S99922A Unspecified injury of left foot, initial encounter: Secondary | ICD-10-CM | POA: Diagnosis present

## 2018-01-28 MED ORDER — TRAMADOL HCL 50 MG PO TABS: 50.0000 mg | ORAL_TABLET | Freq: Two times a day (BID) | ORAL | 0 refills | Status: DC | PRN

## 2018-01-28 MED ORDER — IBUPROFEN 600 MG PO TABS: 600.0000 mg | ORAL_TABLET | Freq: Three times a day (TID) | ORAL | 0 refills | Status: DC | PRN

## 2018-01-28 MED ORDER — IBUPROFEN 400 MG PO TABS
400.0000 mg | ORAL_TABLET | Freq: Once | ORAL | Status: AC
  Administered 2018-01-28: 400 mg via ORAL
  Filled 2018-01-28: qty 1

## 2018-01-28 MED ORDER — NAPROXEN 500 MG PO TABS
500.0000 mg | ORAL_TABLET | Freq: Once | ORAL | Status: AC
  Administered 2018-01-28: 500 mg via ORAL
  Filled 2018-01-28: qty 1

## 2018-01-28 MED ORDER — TRAMADOL HCL 50 MG PO TABS
50.0000 mg | ORAL_TABLET | Freq: Once | ORAL | Status: DC
  Filled 2018-01-28: qty 1

## 2018-01-28 NOTE — ED Provider Notes (Signed)
[facility] Emergency Department Provider Note   ____________________________________________   First MD Initiated Contact with Patient 01/28/18 1124     (approximate)  I have reviewed the triage vital signs and the nursing notes.   HISTORY  Chief Complaint Foot Pain and Foot Injury    HPI Sheri Woods is a 32 y.o. female patient complain of left foot pain secondary to a twisting incident with stepped in a hole last night.  Patient rates the pain as 8/10.  Patient described pain is "achy".  Patient did pain increased with ambulation.  No palliative measure for complaint.  Past Medical History:  Diagnosis Date  . Head trauma   . Headache(784.0)   . Seizures (HCC)   . Stroke [facility])     Patient Active Problem List   Diagnosis Date Noted  . Aphasia 11/10/2017  . CVA (cerebral infarction) 11/16/2012  . Cocaine abuse (HCC) 11/16/2012  . 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 Altered mental status 11/15/2012  . Abnormal CT scan of head 11/15/2012  . Encephalopathy acute 11/15/2012  . 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 Migraine headache 11/15/2012    Past Surgical History:  Procedure Laterality Date  . CESAREAN SECTION     X 4  . TEE WITHOUT CARDIOVERSION N/A 11/17/2012   Procedure: TRANSESOPHAGEAL ECHOCARDIOGRAM (TEE);  Surgeon: Dolores Patty, MD;  Location: [facility] ENDOSCOPY;  Service: Cardiovascular;  Laterality: N/A;    Prior to Admission medications   Medication Sig Start Date End Date Taking? Authorizing Provider  aspirin 81 MG chewable tablet Chew 1 tablet (81 mg total) by mouth daily. 11/11/17   Katha Hamming, MD  ibuprofen (ADVIL,MOTRIN) 600 MG tablet Take 1 tablet (600 mg total) by mouth every 8 (eight) hours as needed. 01/28/18   Joni Reining, PA-C  lisinopril (PRINIVIL,ZESTRIL) 10 MG tablet Take 1 tablet (10 mg total) by mouth daily. 11/11/17   Katha Hamming, MD  sertraline (ZOLOFT) 50 MG tablet Take 1 tablet (50 mg total) by  mouth at bedtime. 11/11/17   Katha Hamming, MD  traMADol (ULTRAM) 50 MG tablet Take 1 tablet (50 mg total) by mouth every 12 (twelve) hours as needed. 01/28/18   Joni Reining, PA-C    Allergies Bee venom; Hydrocodone; and Penicillins  No family history on file.  Social History Social History   Tobacco Use  . Smoking status: Current Every Day Smoker    Packs/day: 0.50    Years: 15.00    Pack years: 7.50    Types: Cigarettes  . Smokeless tobacco: Never Used  Substance Use Topics  . Alcohol use: No  . Drug use: Yes    Types: Cocaine    Comment: last used within the last month    Review of Systems  Constitutional: No fever/chills Eyes: No visual changes. ENT: No sore throat. Cardiovascular: Denies chest pain. Respiratory: Denies shortness of breath. Gastrointestinal: No abdominal pain.  No nausea, no vomiting.  No diarrhea.  No constipation. Genitourinary: Negative for dysuria. Musculoskeletal: Negative for back pain. Skin: Negative for rash. Neurological: Negative for headaches, focal weakness or numbness.  Pseudoseizure. Psychiatric:Cocaine and amphetamine abuse. Allergic/Immunilogical: See medication list ____________________________________________   PHYSICAL EXAM:  VITAL SIGNS: ED Triage Vitals  Enc Vitals Group     BP 01/28/18 1116 (!) 147/104     Pulse Rate 01/28/18 1116 79     Resp 01/28/18 1116 18     Temp 01/28/18 1116 98.7 F (37.1 C)     Temp Source 01/28/18 1116  Oral     SpO2 01/28/18 1116 99 %     Weight 01/28/18 1112 110 lb (49.9 kg)     Height 01/28/18 1112 5\' 2"  (1.575 m)     Head Circumference --      Peak Flow --      Pain Score 01/28/18 1112 8     Pain Loc --      Pain Edu? --      Excl. in GC? --     Constitutional: Alert and oriented. Well appearing and in no acute distress. Cardiovascular: Normal rate, regular rhythm. Grossly normal heart sounds.  Good peripheral circulation. Respiratory: Normal respiratory effort.  No  retractions. Lungs CTAB. Musculoskeletal: No obvious deformities to left foot.  Patient decreased range of motion noted by complaint of pain. Neurologic:  Normal speech and language. No gross focal neurologic deficits are appreciated. No gait instability. Skin:  Skin is warm, dry and intact. No rash noted.  No obvious edema or erythema. Psychiatric: Mood and affect are normal. Speech and behavior are normal.  ____________________________________________   LABS (all labs ordered are listed, but only abnormal results are displayed)  Labs Reviewed - No data to display ____________________________________________  EKG   ____________________________________________  RADIOLOGY   Official radiology report(s): Dg Foot Complete Left  Result Date: 01/28/2018 CLINICAL DATA:  Left foot pain around the base of the first and second toes after stepping in a hole yesterday. EXAM: LEFT FOOT - COMPLETE 3+ VIEW COMPARISON:  Left foot x-rays dated May 10, 2011. FINDINGS: There is no evidence of fracture or dislocation. There is no evidence of arthropathy or other focal bone abnormality. Soft tissues are unremarkable. IMPRESSION: Negative. Electronically Signed   By: Obie DredgeWilliam T Derry M.D.   On: 01/28/2018 11:57    ____________________________________________   PROCEDURES  Procedure(s) performed:   Procedures  Critical Care performed:   ____________________________________________   INITIAL IMPRESSION / ASSESSMENT AND PLAN / ED COURSE  As part of my medical decision making, I reviewed the following data within the electronic MEDICAL RECORD NUMBER    Left foot pain secondary to sprain.  Discussed negative x-ray findings with patient.  Patient given discharge care instructions.  Patient placed in Ace wrap and given open shoe for support.  Patient advised follow-up PCP if complaints persist.  Patient given a prescription for ibuprofen.       ____________________________________________   FINAL CLINICAL IMPRESSION(S) / ED DIAGNOSES  Final diagnoses:  Sprain of left foot, initial encounter        Note:  This document was prepared using Dragon voice recognition software and may include unintentional dictation errors.    Joni ReiningSmith, Ronald K, PA-C 01/28/18 1217    Sharyn CreamerQuale, Mark, MD 01/28/18 [id number]

## 2018-01-28 NOTE — Discharge Instructions (Signed)
Follow discharge care instruction take medication as directed. °

## 2018-01-28 NOTE — ED Triage Notes (Signed)
Pt reports last pm she stepped in a hole twisting her left foot. Pt reports pain no better today and cannot walk on it.

## 2018-03-06 ENCOUNTER — Other Ambulatory Visit: Payer: Self-pay

## 2018-03-06 ENCOUNTER — Emergency Department: Payer: Medicaid Other

## 2018-03-06 ENCOUNTER — Emergency Department
Admission: EM | Admit: 2018-03-06 | Discharge: 2018-03-06 | Disposition: A | Discharge status: Home / Self Care | Payer: Medicaid Other | Attending: Emergency Medicine | Admitting: Emergency Medicine

## 2018-03-06 DIAGNOSIS — K529 Noninfective gastroenteritis and colitis, unspecified: Secondary | ICD-10-CM | POA: Diagnosis not present

## 2018-03-06 DIAGNOSIS — Z79899 Other long term (current) drug therapy: Secondary | ICD-10-CM | POA: Diagnosis not present

## 2018-03-06 DIAGNOSIS — Z7982 Long term (current) use of aspirin: Secondary | ICD-10-CM | POA: Diagnosis not present

## 2018-03-06 DIAGNOSIS — R109 Unspecified abdominal pain: Secondary | ICD-10-CM

## 2018-03-06 DIAGNOSIS — R1032 Left lower quadrant pain: Secondary | ICD-10-CM | POA: Diagnosis present

## 2018-03-06 DIAGNOSIS — F17210 Nicotine dependence, cigarettes, uncomplicated: Secondary | ICD-10-CM | POA: Diagnosis not present

## 2018-03-06 LAB — URINALYSIS, COMPLETE (UACMP) WITH MICROSCOPIC
Bacteria, UA: NONE SEEN
Bilirubin Urine: NEGATIVE
GLUCOSE, UA: NEGATIVE mg/dL
Ketones, ur: NEGATIVE mg/dL
Leukocytes, UA: NEGATIVE
NITRITE: NEGATIVE
PH: 7 (ref 5.0–8.0)
Protein, ur: NEGATIVE mg/dL
SPECIFIC GRAVITY, URINE: 1.006 (ref 1.005–1.030)

## 2018-03-06 LAB — COMPREHENSIVE METABOLIC PANEL
ALK PHOS: 56 U/L (ref 38–126)
ALT: 16 U/L (ref 0–44)
AST: 22 U/L (ref 15–41)
Albumin: 4.3 g/dL (ref 3.5–5.0)
Anion gap: 9 (ref 5–15)
BILIRUBIN TOTAL: 0.6 mg/dL (ref 0.3–1.2)
BUN: 12 mg/dL (ref 6–20)
CALCIUM: 8.9 mg/dL (ref 8.9–10.3)
CO2: 27 mmol/L (ref 22–32)
CREATININE: 0.58 mg/dL (ref 0.44–1.00)
Chloride: 104 mmol/L (ref 98–111)
GFR calc non Af Amer: >60 mL/min (ref >60)
Glucose, Bld: 101 mg/dL — ABNORMAL HIGH (ref 70–99)
Potassium: 3.9 mmol/L (ref 3.5–5.1)
Sodium: 140 mmol/L (ref 135–145)
TOTAL PROTEIN: 7.9 g/dL (ref 6.5–8.1)

## 2018-03-06 LAB — CBC
HCT: 38.6 % (ref 35.0–47.0)
Hemoglobin: 12.8 g/dL (ref 12.0–16.0)
MCH: 28.9 pg (ref 26.0–34.0)
MCHC: 33.3 g/dL (ref 32.0–36.0)
MCV: 86.7 fL (ref 80.0–100.0)
PLATELETS: 349 10*3/uL (ref 150–440)
RBC: 4.44 MIL/uL (ref 3.80–5.20)
RDW: 14.4 % (ref 11.5–14.5)
WBC: 15.3 10*3/uL — ABNORMAL HIGH (ref 3.6–11.0)

## 2018-03-06 LAB — POCT PREGNANCY, URINE: Preg Test, Ur: NEGATIVE

## 2018-03-06 LAB — LIPASE, BLOOD: Lipase: 34 U/L (ref 11–51)

## 2018-03-06 MED ORDER — ONDANSETRON HCL 4 MG/2ML IJ SOLN
4.0000 mg | Freq: Once | INTRAMUSCULAR | Status: AC
  Administered 2018-03-06: 4 mg via INTRAVENOUS
  Filled 2018-03-06: qty 2

## 2018-03-06 MED ORDER — ONDANSETRON 4 MG PO TBDP: 4.0000 mg | ORAL_TABLET | Freq: Three times a day (TID) | ORAL | 0 refills | Status: DC | PRN

## 2018-03-06 MED ORDER — KETOROLAC TROMETHAMINE 30 MG/ML IJ SOLN
15.0000 mg | Freq: Once | INTRAMUSCULAR | Status: AC
  Administered 2018-03-06: 15 mg via INTRAVENOUS
  Filled 2018-03-06: qty 1

## 2018-03-06 MED ORDER — FENTANYL CITRATE (PF) 100 MCG/2ML IJ SOLN
50.0000 ug | Freq: Once | INTRAMUSCULAR | Status: AC
  Administered 2018-03-06: 50 ug via INTRAVENOUS
  Filled 2018-03-06: qty 2

## 2018-03-06 NOTE — ED Provider Notes (Signed)
[facility] Emergency Department Provider Note  ____________________________________________  Time seen: Approximately 8:11 AM  I have reviewed the triage vital signs and the nursing notes.   HISTORY  Chief Complaint Abdominal Pain   HPI Sheri Woods is a 32 y.o. female with a history of stroke on aspirin, kidney stone, and polysubstance abuse who presents for evaluation of abdominal pain.  Patient reports 3 days of intermittent sharp left-sided abdominal pain.  She reports that she is unable to tell me if she has flank pain because since her stroke she has no sensation to her back.  Since yesterday she reports dysuria but no hematuria.  She reports that the pain is similar to her prior kidney stone that she has had.  No nausea, vomiting, diarrhea, constipation, vaginal discharge, fever, chills, chest pain or shortness of breath.  Patient has had C-sections but no other abdominal surgeries.  She is currently on her menstrual period.  Past Medical History:  Diagnosis Date  . Head trauma   . Headache(784.0)   . Seizures (HCC)   . Stroke [facility])     Patient Active Problem List   Diagnosis Date Noted  . Aphasia 11/10/2017  . CVA (cerebral infarction) 11/16/2012  . Cocaine abuse (HCC) 11/16/2012  . 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 Altered mental status 11/15/2012  . Abnormal CT scan of head 11/15/2012  . Encephalopathy acute 11/15/2012  . 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 Migraine headache 11/15/2012    Past Surgical History:  Procedure Laterality Date  . CESAREAN SECTION     X 4  . TEE WITHOUT CARDIOVERSION N/A 11/17/2012   Procedure: TRANSESOPHAGEAL ECHOCARDIOGRAM (TEE);  Surgeon: Dolores Patty, MD;  Location: [facility] ENDOSCOPY;  Service: Cardiovascular;  Laterality: N/A;    Prior to Admission medications   Medication Sig Start Date End Date Taking? Authorizing Provider  aspirin 81 MG chewable tablet Chew 1 tablet (81 mg total) by mouth daily.  11/11/17   Katha Hamming, MD  ibuprofen (ADVIL,MOTRIN) 600 MG tablet Take 1 tablet (600 mg total) by mouth every 8 (eight) hours as needed. 01/28/18   Joni Reining, PA-C  lisinopril (PRINIVIL,ZESTRIL) 10 MG tablet Take 1 tablet (10 mg total) by mouth daily. 11/11/17   Katha Hamming, MD  ondansetron (ZOFRAN ODT) 4 MG disintegrating tablet Take 1 tablet (4 mg total) by mouth every 8 (eight) hours as needed for nausea or vomiting. 03/06/18   Don Perking, [place], MD  sertraline (ZOLOFT) 50 MG tablet Take 1 tablet (50 mg total) by mouth at bedtime. 11/11/17   Katha Hamming, MD  traMADol (ULTRAM) 50 MG tablet Take 1 tablet (50 mg total) by mouth every 12 (twelve) hours as needed. 01/28/18   Joni Reining, PA-C    Allergies Bee venom; Hydrocodone; and Penicillins  No family history on file.  Social History Social History   Tobacco Use  . Smoking status: Current Every Day Smoker    Packs/day: 0.50    Years: 15.00    Pack years: 7.50    Types: Cigarettes  . Smokeless tobacco: Never Used  Substance Use Topics  . Alcohol use: No  . Drug use: Yes    Types: Cocaine    Comment: last used within the last month    Review of Systems  Constitutional: Negative for fever. Eyes: Negative for visual changes. ENT: Negative for sore throat. Neck: No neck pain  Cardiovascular: Negative for chest pain. Respiratory: Negative for shortness of breath. Gastrointestinal: + L sided abdominal pain.  No vomiting or diarrhea. Genitourinary: Negative for dysuria. Musculoskeletal: Negative for back pain. Skin: Negative for rash. Neurological: Negative for headaches, weakness or numbness. Psych: No SI or HI  ____________________________________________   PHYSICAL EXAM:  VITAL SIGNS: ED Triage Vitals  Enc Vitals Group     BP 03/06/18 0711 (!) 160/114     Pulse Rate 03/06/18 0711 66     Resp 03/06/18 0711 18     Temp 03/06/18 0711 98.4 F (36.9 C)     Temp Source 03/06/18 0711  Oral     SpO2 03/06/18 0711 99 %     Weight 03/06/18 0712 101 lb (45.8 kg)     Height 03/06/18 0712 5\' 3"  (1.6 m)     Head Circumference --      Peak Flow --      Pain Score 03/06/18 0712 10     Pain Loc --      Pain Edu? --      Excl. in GC? --     Constitutional: Alert and oriented. Well appearing and in no apparent distress. HEENT:      Head: Normocephalic and atraumatic.         Eyes: Conjunctivae are normal. Sclera is non-icteric.       Mouth/Throat: Mucous membranes are moist.       Neck: Supple with no signs of meningismus. Cardiovascular: Regular rate and rhythm. No murmurs, gallops, or rubs. 2+ symmetrical distal pulses are present in all extremities. No JVD. Respiratory: Normal respiratory effort. Lungs are clear to auscultation bilaterally. No wheezes, crackles, or rhonchi.  Gastrointestinal: Soft, tenderness to palpation over the LUQ and LLQ, and non distended with positive bowel sounds. No rebound or guarding. Genitourinary: No CVA tenderness. Musculoskeletal: Nontender with normal range of motion in all extremities. No edema, cyanosis, or erythema of extremities. Neurologic: Normal speech and language. Face is symmetric. Moving all extremities. No gross focal neurologic deficits are appreciated. Skin: Skin is warm, dry and intact. No rash noted. Psychiatric: Mood and affect are normal. Speech and behavior are normal.  ____________________________________________   LABS (all labs ordered are listed, but only abnormal results are displayed)  Labs Reviewed  COMPREHENSIVE METABOLIC PANEL - Abnormal; Notable for the following components:      Result Value   Glucose, Bld 101 (*)    All other components within normal limits  CBC - Abnormal; Notable for the following components:   WBC 15.3 (*)    All other components within normal limits  URINALYSIS, COMPLETE (UACMP) WITH MICROSCOPIC - Abnormal; Notable for the following components:   Color, Urine STRAW (*)     APPearance CLEAR (*)    Hgb urine dipstick MODERATE (*)    All other components within normal limits  LIPASE, BLOOD  POCT PREGNANCY, URINE  POC URINE PREG, ED   ____________________________________________  EKG  none  ____________________________________________  RADIOLOGY  I have personally reviewed the images performed during this visit and I agree with the Radiologist's read.   Interpretation by Radiologist:  US Pelvis Transvanginal Non-ob (tv Only)  Result Date: 03/06/2018 CLINICAL DATA:  Left-sided abdominal pain for 3 days EXAM: TRANSABDOMINAL AND TRANSVAGINAL ULTRASOUND OF PELVIS DOPPLER ULTRASOUND OF OVARIES TECHNIQUE: Both transabdominal and transvaginal ultrasound examinations of the pelvis were performed. Transabdominal technique was performed for global imaging of the pelvis including uterus, ovaries, adnexal regions, and pelvic cul-de-sac. It was necessary to proceed with endovaginal exam following the transabdominal exam to visualize the ovaries. Color and duplex Doppler ultrasound  was utilized to evaluate blood flow to the ovaries. COMPARISON:  CT urogram from earlier in the same day. FINDINGS: Uterus Measurements: 7.8 x 3.9 x 6.0 cm. No fibroids or other mass visualized. Endometrium Thickness: 4.3 mm.  No focal abnormality visualized. Right ovary Measurements: 2.1 x 2.1 x 1.0 cm. Normal appearance/no adnexal mass. Left ovary Measurements: 3.2 x 2.1 x 2.3 cm. Normal appearance/no adnexal mass. Pulsed Doppler evaluation of both ovaries demonstrates normal low-resistance arterial and venous waveforms. Other findings No abnormal free fluid. The area of abnormality seen on recent CT examination likely represents a fluid-filled loop of bowel as no fluid collection is noted adjacent to the uterus. IMPRESSION: Previously seen fluid collection adjacent to the uterus is not borne out on today's ultrasound exam and was likely related to fluid-filled loop of bowel. Normal-appearing ovaries  with follicular changes bilaterally. No acute abnormality noted. Electronically Signed   By: Alcide Clever M.D.   On: 03/06/2018 09:43   US Pelvis (transabdominal Only)  Result Date: 03/06/2018 CLINICAL DATA:  Left-sided abdominal pain for 3 days EXAM: TRANSABDOMINAL AND TRANSVAGINAL ULTRASOUND OF PELVIS DOPPLER ULTRASOUND OF OVARIES TECHNIQUE: Both transabdominal and transvaginal ultrasound examinations of the pelvis were performed. Transabdominal technique was performed for global imaging of the pelvis including uterus, ovaries, adnexal regions, and pelvic cul-de-sac. It was necessary to proceed with endovaginal exam following the transabdominal exam to visualize the ovaries. Color and duplex Doppler ultrasound was utilized to evaluate blood flow to the ovaries. COMPARISON:  CT urogram from earlier in the same day. FINDINGS: Uterus Measurements: 7.8 x 3.9 x 6.0 cm. No fibroids or other mass visualized. Endometrium Thickness: 4.3 mm.  No focal abnormality visualized. Right ovary Measurements: 2.1 x 2.1 x 1.0 cm. Normal appearance/no adnexal mass. Left ovary Measurements: 3.2 x 2.1 x 2.3 cm. Normal appearance/no adnexal mass. Pulsed Doppler evaluation of both ovaries demonstrates normal low-resistance arterial and venous waveforms. Other findings No abnormal free fluid. The area of abnormality seen on recent CT examination likely represents a fluid-filled loop of bowel as no fluid collection is noted adjacent to the uterus. IMPRESSION: Previously seen fluid collection adjacent to the uterus is not borne out on today's ultrasound exam and was likely related to fluid-filled loop of bowel. Normal-appearing ovaries with follicular changes bilaterally. No acute abnormality noted. Electronically Signed   By: Alcide Clever M.D.   On: 03/06/2018 09:43   US Pelvic Doppler (torsion R/o Or Mass Arterial Flow)  Result Date: 03/06/2018 CLINICAL DATA:  Left-sided abdominal pain for 3 days EXAM: TRANSABDOMINAL AND  TRANSVAGINAL ULTRASOUND OF PELVIS DOPPLER ULTRASOUND OF OVARIES TECHNIQUE: Both transabdominal and transvaginal ultrasound examinations of the pelvis were performed. Transabdominal technique was performed for global imaging of the pelvis including uterus, ovaries, adnexal regions, and pelvic cul-de-sac. It was necessary to proceed with endovaginal exam following the transabdominal exam to visualize the ovaries. Color and duplex Doppler ultrasound was utilized to evaluate blood flow to the ovaries. COMPARISON:  CT urogram from earlier in the same day. FINDINGS: Uterus Measurements: 7.8 x 3.9 x 6.0 cm. No fibroids or other mass visualized. Endometrium Thickness: 4.3 mm.  No focal abnormality visualized. Right ovary Measurements: 2.1 x 2.1 x 1.0 cm. Normal appearance/no adnexal mass. Left ovary Measurements: 3.2 x 2.1 x 2.3 cm. Normal appearance/no adnexal mass. Pulsed Doppler evaluation of both ovaries demonstrates normal low-resistance arterial and venous waveforms. Other findings No abnormal free fluid. The area of abnormality seen on recent CT examination likely represents a fluid-filled loop of  bowel as no fluid collection is noted adjacent to the uterus. IMPRESSION: Previously seen fluid collection adjacent to the uterus is not borne out on today's ultrasound exam and was likely related to fluid-filled loop of bowel. Normal-appearing ovaries with follicular changes bilaterally. No acute abnormality noted. Electronically Signed   By: Alcide CleverMark  Lukens M.D.   On: 03/06/2018 09:43   Ct Renal Stone Study  Result Date: 03/06/2018 CLINICAL DATA:  Lower abdominal pain EXAM: CT ABDOMEN AND PELVIS WITHOUT CONTRAST TECHNIQUE: Multidetector CT imaging of the abdomen and pelvis was performed following the standard protocol without oral or IV contrast. COMPARISON:  July 14, 2012 FINDINGS: Lower chest: Lung bases are clear. Hepatobiliary: Liver measures 19.5 cm in length. No focal liver lesions are evident on this  noncontrast enhanced study. Gallbladder wall is not appreciably thickened. There is no biliary duct dilatation. Pancreas: No evident pancreatic mass or inflammatory focus. Spleen: No splenic lesions are evident. There is a small accessory spleen anteriorly. Adrenals/Urinary Tract: Adrenals bilaterally appear normal. There is slight nephrocalcinosis bilaterally. There is no mass or hydronephrosis on either side. There is no well-defined renal or ureteral calculus on either side. Urinary bladder wall is moderately thickened. Note that the uterus impresses upon the rightward aspect of the urinary bladder. Stomach/Bowel: There is moderate stool throughout the colon. There is fluid throughout most loops of small bowel. There is no appreciable bowel wall thickening. There is no appreciable bowel obstruction. No evident free air or portal venous air. Vascular/Lymphatic: There is no evident abdominal aortic aneurysm. No vascular lesions are appreciable on this noncontrast enhanced study. There is no appreciable adenopathy in the abdomen or pelvis. Reproductive: The uterus is anteverted. Uterus overall appears rather prominent without well-defined mass. Note that the uterus impresses upon the rightward aspect of the urinary bladder. No pelvic mass is appreciable on this study. A small amount of loculated fluid is noted immediately adjacent to the uterus on the left, possibly due to recent ovarian cyst rupture. Other: Appendix appears normal. No abscess or ascites is evident in the abdomen or pelvis. Musculoskeletal: There is no blastic or lytic bone lesion. No intramuscular or abdominal wall lesion evident. IMPRESSION: 1. Most loops of small bowel are fluid-filled. Suspect early ileus or enteritis. No bowel obstruction. 2. Small amount of loculated fluid immediately adjacent to the uterus. Suspect recent ovarian cyst rupture. 3. Urinary bladder wall thickening, likely indicative of a degree of cystitis. Advise correlation  with urinalysis. No renal or ureteral calculus. No hydronephrosis. There is slight nephrocalcinosis bilaterally. 4.  Prominent liver without focal lesion evident. 5. Uterus appears mildly prominent overall without focal lesion seen by noncontrast CT. Electronically Signed   By: Bretta BangWilliam  Woodruff III M.D.   On: 03/06/2018 07:57     ____________________________________________   PROCEDURES  Procedure(s) performed: None Procedures Critical Care performed:  None ____________________________________________   INITIAL IMPRESSION / ASSESSMENT AND PLAN / ED COURSE  32 y.o. female with a history of stroke on aspirin, kidney stone, and polysubstance abuse who presents for evaluation of abdominal pain.  Patient has tenderness to palpation on the left upper and lower quadrant with no rebound or guarding, vital signs are within normal limits.  Differential diagnoses including kidney stone versus pyelonephritis versus cystitis versus ovarian pathology.  Pregnancy test is negative and patient is currently on her menstrual.  CT renal done showing small bowel fluid-filled concerning for possible early enteritis with no SBO.  Also small amount of loculated fluid adjacent to the uterus suspected  ovarian rupture.  We will send patient for transvaginal ultrasound.  No evidence of kidney stone.  Labs showing white count of 15.3.  UA is negative for urinary tract infection.  Clinical Course as of Mar 06 1053  Fri Mar 06, 2018  1050 CT initially concerning for enteritis and questionable possible ovarian cyst.  Patient was sent for a transvaginal ultrasound which shows that the fluid collection is actually seen in the intestines and not around the uterus or the ovaries.  Ultrasound was normal.  CT is consistent with enteritis.  Leukocytosis with white count of 15 also goes with the picture of enteritis.  Will provide patient with a prescription for Zofran, recommend a bland diet and increase oral hydration at home.   Discussed return precautions for new or worsening pain or fever.  Patient is requesting a prescription for a cane since she has gait instability since her stroke and does not own a cane at this time.  I will provide her with that..   [CV]    Clinical Course User Index [CV] Don Perking [place], MD     As part of my medical decision making, I reviewed the following data within the electronic MEDICAL RECORD NUMBER Nursing notes reviewed and incorporated, Labs reviewed , Old chart reviewed, Notes from prior ED visits and [place] Controlled Substance Database    Pertinent labs & imaging results that were available during my care of the patient were reviewed by me and considered in my medical decision making (see chart for details).    ____________________________________________   FINAL CLINICAL IMPRESSION(S) / ED DIAGNOSES  Final diagnoses:  Left sided abdominal pain  Enteritis      NEW MEDICATIONS STARTED DURING THIS VISIT:  ED Discharge Orders         Ordered    ondansetron (ZOFRAN ODT) 4 MG disintegrating tablet  Every 8 hours PRN     03/06/18 1053           Note:  This document was prepared using Dragon voice recognition software and may include unintentional dictation errors.    Don Perking, [place], MD 03/06/18 1055

## 2018-03-06 NOTE — ED Notes (Signed)
ED Provider at bedside. 

## 2018-03-06 NOTE — ED Notes (Signed)
Pt back from US

## 2018-03-06 NOTE — Progress Notes (Signed)
While rounding Chaplain heard Pt want something to drink. Chaplain aske it he could help the Pt. Chaplain took Pt ice chips. Pt seemed bothered by something. Chaplain asked if he could pray for the Pt. She reluctantly agreed. Noted after the prayer she smiled. Chaplain informed Pt that she can call chaplain if needed.    03/06/18 1000  Clinical Encounter Type  Visited With Patient  Visit Type Initial  Referral From Nurse  Spiritual Encounters  Spiritual Needs Prayer;Emotional

## 2018-03-06 NOTE — ED Notes (Signed)
Patient transported to Ultrasound 

## 2018-03-06 NOTE — ED Notes (Signed)
Pt has been out of BP medications X 5 days. Has followup with PCP for refill next week per patient.

## 2018-03-06 NOTE — ED Notes (Signed)
Pt up to bathroom. Ambulates well and appears comfortable.

## 2018-03-06 NOTE — ED Notes (Signed)
Patient transported to CT 

## 2018-03-06 NOTE — ED Notes (Signed)
Pt alert and oriented X4, active, cooperative, pt in NAD. RR even and unlabored, color WNL.  Pt informed to return if any life threatening symptoms occur.  Discharge and followup instructions reviewed.  

## 2018-03-06 NOTE — ED Triage Notes (Addendum)
Pt reports 3 days of lower abd pain that radiates upward. Denies flank pain, reports pain with urination,states that the pain reminds her of her last kidney stone 3-4 years ago. Pt reports out of her bp meds, and reports hx of stroke

## 2018-10-10 ENCOUNTER — Emergency Department
Admission: EM | Admit: 2018-10-10 | Discharge: 2018-10-10 | Disposition: A | Discharge status: Home / Self Care | Payer: Medicare Other | Attending: Emergency Medicine | Admitting: Emergency Medicine

## 2018-10-10 ENCOUNTER — Emergency Department: Payer: Medicare Other

## 2018-10-10 ENCOUNTER — Other Ambulatory Visit: Payer: Self-pay

## 2018-10-10 DIAGNOSIS — Y9389 Activity, other specified: Secondary | ICD-10-CM | POA: Insufficient documentation

## 2018-10-10 DIAGNOSIS — Z8673 Personal history of transient ischemic attack (TIA), and cerebral infarction without residual deficits: Secondary | ICD-10-CM | POA: Insufficient documentation

## 2018-10-10 DIAGNOSIS — F17210 Nicotine dependence, cigarettes, uncomplicated: Secondary | ICD-10-CM | POA: Insufficient documentation

## 2018-10-10 DIAGNOSIS — F1410 Cocaine abuse, uncomplicated: Secondary | ICD-10-CM | POA: Insufficient documentation

## 2018-10-10 DIAGNOSIS — Z7982 Long term (current) use of aspirin: Secondary | ICD-10-CM | POA: Diagnosis not present

## 2018-10-10 DIAGNOSIS — Z79899 Other long term (current) drug therapy: Secondary | ICD-10-CM | POA: Diagnosis not present

## 2018-10-10 DIAGNOSIS — Y92410 Unspecified street and highway as the place of occurrence of the external cause: Secondary | ICD-10-CM | POA: Diagnosis not present

## 2018-10-10 DIAGNOSIS — W2210XA Striking against or struck by unspecified automobile airbag, initial encounter: Secondary | ICD-10-CM | POA: Diagnosis not present

## 2018-10-10 DIAGNOSIS — Y998 Other external cause status: Secondary | ICD-10-CM | POA: Diagnosis not present

## 2018-10-10 DIAGNOSIS — S161XXA Strain of muscle, fascia and tendon at neck level, initial encounter: Secondary | ICD-10-CM

## 2018-10-10 DIAGNOSIS — S8002XA Contusion of left knee, initial encounter: Secondary | ICD-10-CM | POA: Insufficient documentation

## 2018-10-10 DIAGNOSIS — S199XXA Unspecified injury of neck, initial encounter: Secondary | ICD-10-CM | POA: Diagnosis present

## 2018-10-10 MED ORDER — NAPROXEN 500 MG PO TABS: 500.0000 mg | ORAL_TABLET | Freq: Two times a day (BID) | ORAL | 2 refills | Status: DC

## 2018-10-10 MED ORDER — IBUPROFEN 600 MG PO TABS
600.0000 mg | ORAL_TABLET | Freq: Once | ORAL | Status: AC
  Administered 2018-10-10: 600 mg via ORAL
  Filled 2018-10-10: qty 1
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
## 2018-10-10 NOTE — ED Notes (Signed)
Pt asking for a c-collar but Dr. Cyril Loosen originally said she did not need one. Dr. Cyril Loosen to be notified about pt neck pain and ask again about c-collar.

## 2018-10-10 NOTE — ED Notes (Signed)
No c collar placed - per Cyril Loosen MD

## 2018-10-10 NOTE — ED Notes (Signed)
Patient transported to CT 
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
## 2018-10-10 NOTE — ED Notes (Signed)
Pt placed in c-collar for comfort and pending results of CT scan.

## 2019-04-08 IMAGING — MR MR MRA HEAD W/O CM
11 series · 37 of 48 positions shown · non-contrast
Comparison: Head CT from earlier today

CLINICAL DATA: Aphasia and speech difficulty. History of cocaine
use.

EXAM:
MRI HEAD WITHOUT CONTRAST
MRA HEAD WITHOUT CONTRAST
TECHNIQUE: Multiplanar, multiecho pulse sequences of the brain and surrounding
structures were obtained without intravenous contrast. Angiographic
images of the head were obtained using MRA technique without
contrast.

[Series 2: T1 · sagittal · 5.0mm · 0.47mm/px · 1 of 25 slices shown (1 of 2)]
[im 1/25]
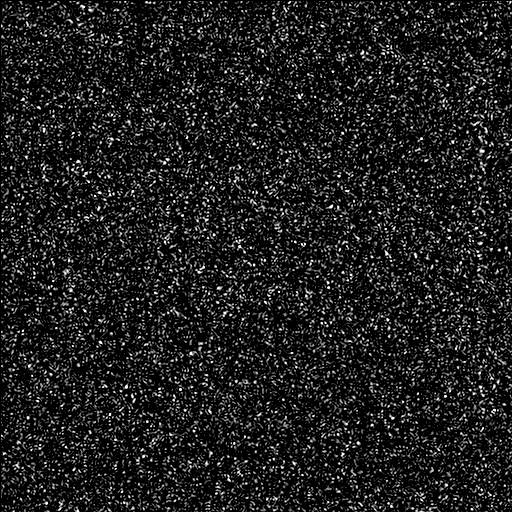

[Series 4: DWI · axial · 3.0mm · 0.94mm/px · z∈[-71,+75]mm · 4 of 50 slices shown (1 of 4)]
[im 1/50]
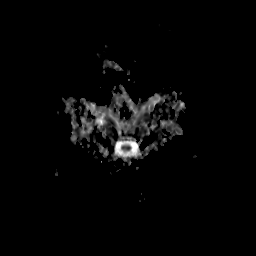
[im 17/50]
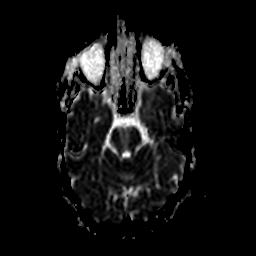
[im 33/50]
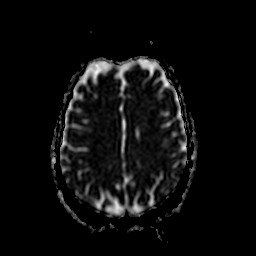
[im 50/50]
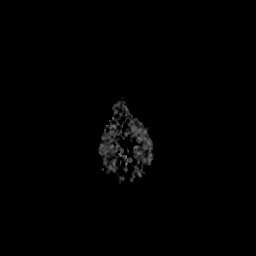

[Series 5: DWI · axial · 3.0mm · 0.94mm/px · z∈[-71,+75]mm · 4 of 50 slices shown (2 of 4)]
[im 1/50]
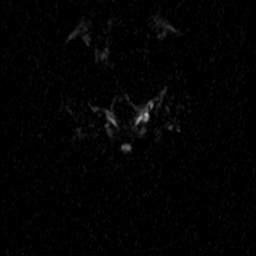
[im 17/50]
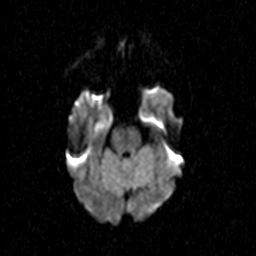
[im 33/50]
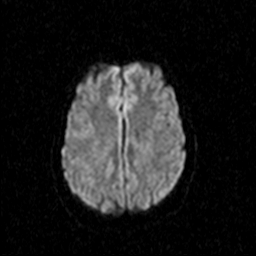
[im 50/50]
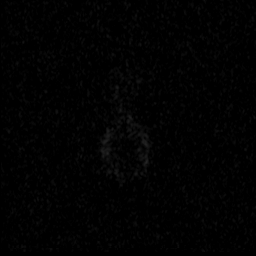

[Series 7: DWI · coronal · 5.0mm · 1.80mm/px · 3 of 36 slices shown (3 of 4)]
[im 1/36]
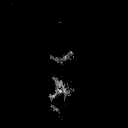
[im 18/36]
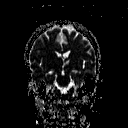
[im 36/36]
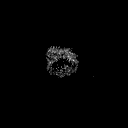

[Series 8: DWI · coronal · 5.0mm · 1.80mm/px · 3 of 37 slices shown (4 of 4)]
[im 1/37]
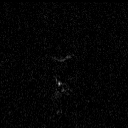
[im 19/37]
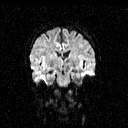
[im 37/37]
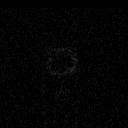

[Series 10: TOF · axial · 0.8mm · 0.39mm/px · z∈[-65,-31]mm · 4 of 118 slices shown]
[im 1/118]
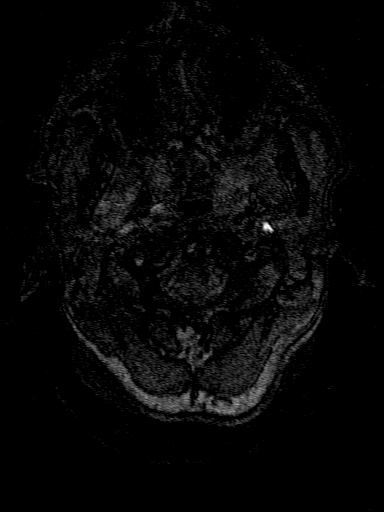
[im 15/118]
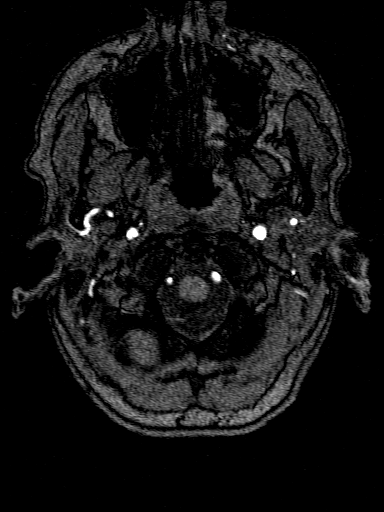
[im 30/118]
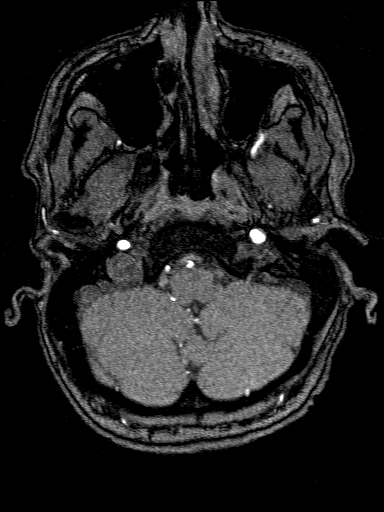
[im 44/118]
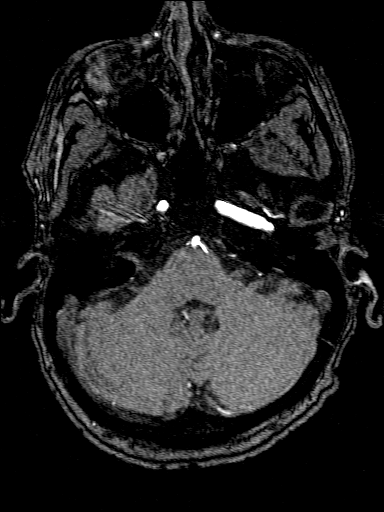

[Series 16: T2 · axial · 5.0mm · 0.45mm/px · z∈[-76,+77]mm · 2 of 23 slices shown (1 of 3)]
[im 1/23]
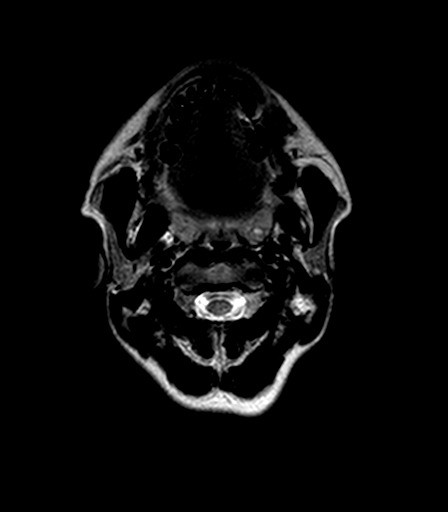
[im 23/23]
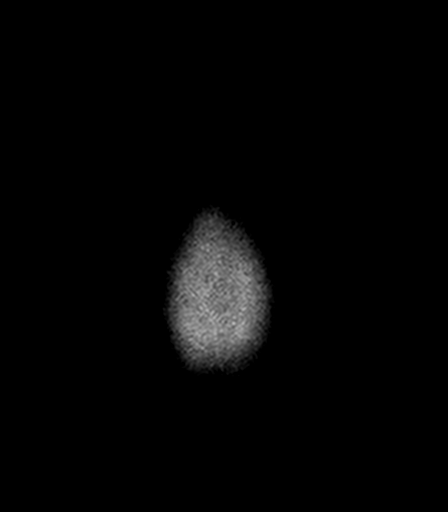

[Series 19: FLAIR · axial · 3.0mm · 0.90mm/px · z∈[-72,+74]mm · 4 of 50 slices shown]
[im 1/50]
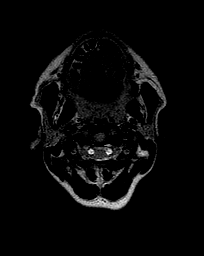
[im 17/50]
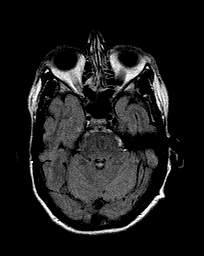
[im 33/50]
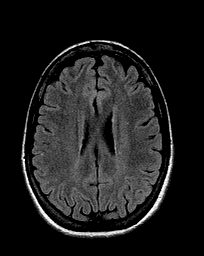
[im 50/50]
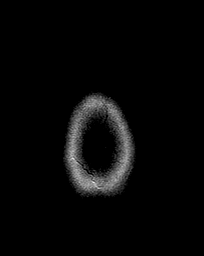

[Series 23: T2 · axial · 5.0mm · 0.45mm/px · z∈[-76,+77]mm · 2 of 23 slices shown (2 of 3)]
[im 1/23]
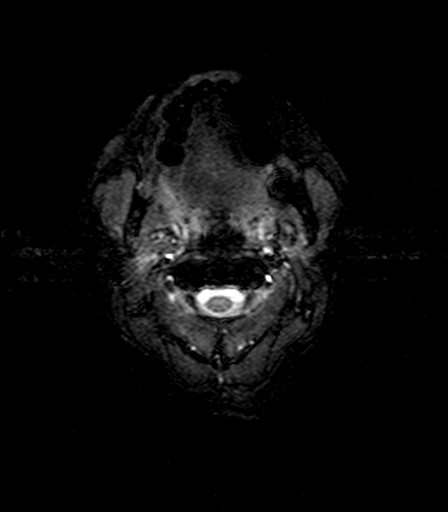
[im 23/23]
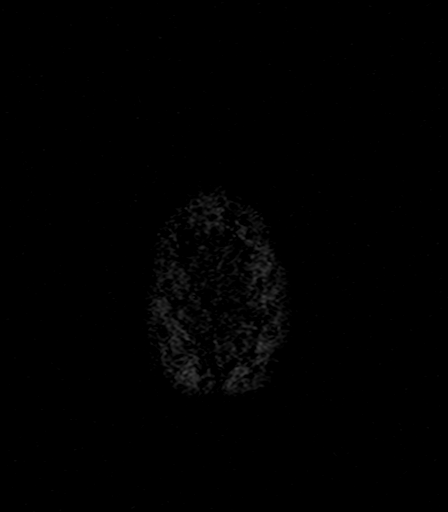

[Series 24: T1 · axial · 1.0mm · 0.50mm/px · z∈[-88,+86]mm · 8 of 176 slices shown (2 of 2)]
[im 1/176]
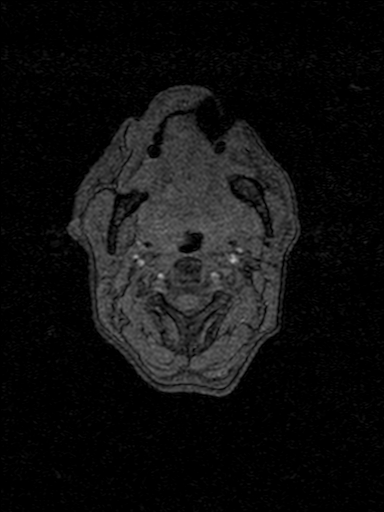
[im 27/176]
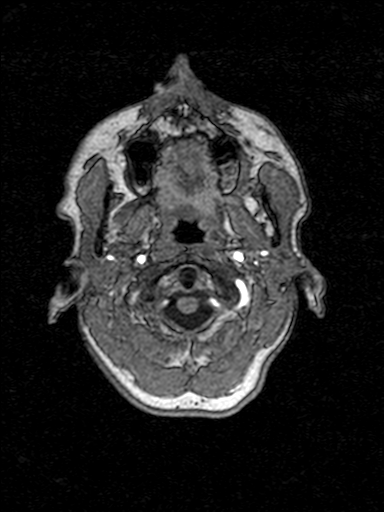
[im 54/176]
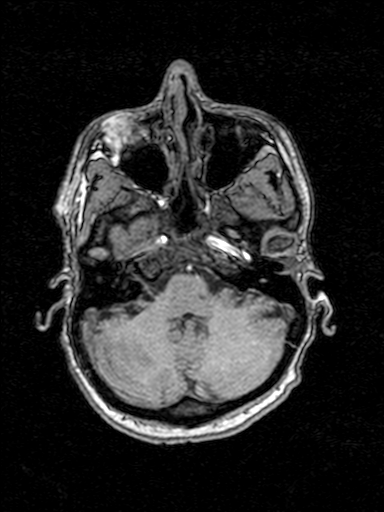
[im 81/176]
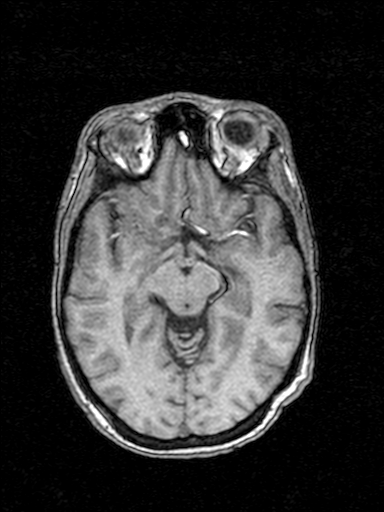
[im 95/176]
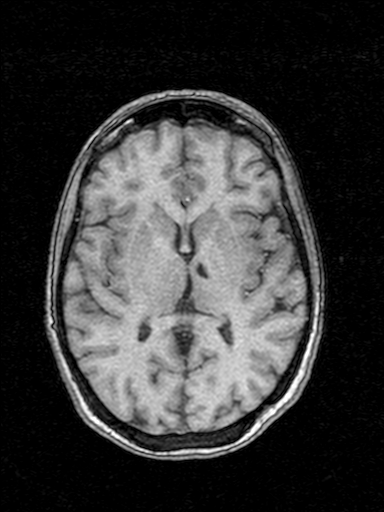
[im 122/176]
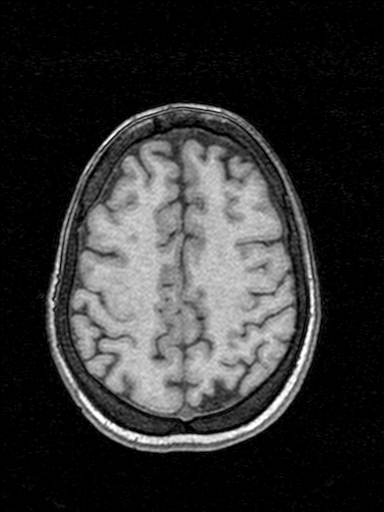
[im 149/176]
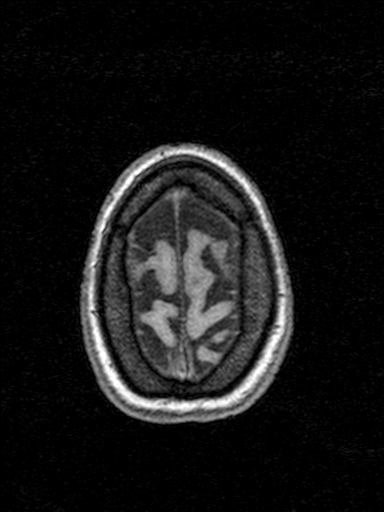
[im 176/176]
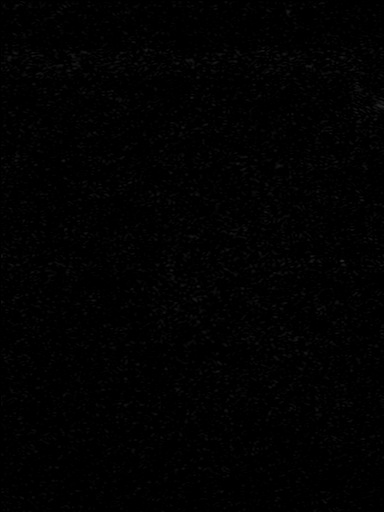

[Series 25: T2 · coronal · 5.0mm · 0.45mm/px · 2 of 27 slices shown (3 of 3)]
[im 1/27]
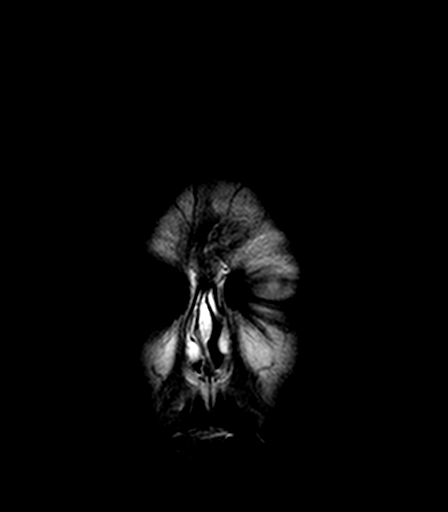
[im 27/27]
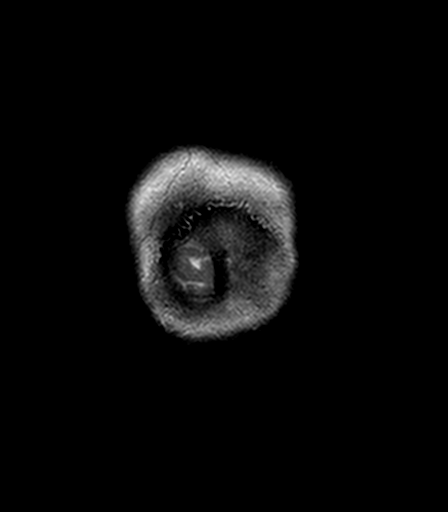

[37 of 48 positions shown; findings below may reference images not displayed]

FINDINGS: MRI HEAD FINDINGS

Brain: No acute infarction, hemorrhage, hydrocephalus, extra-axial
collection or mass lesion. Remote lacunar type infarct in the left
thalamus. Few FLAIR hyperintense foci in the cerebral white matter.

Vascular: Arterial findings below. Normal dural venous sinus flow
voids.

Skull and upper cervical spine: Negative

Sinuses/Orbits: Negative

MRA HEAD FINDINGS

Asymmetric small right ICA in the setting of right A1 hypoplasia and
fetal type left PCA. Codominant vertebral arteries. Dominant right
PICA and left AICA. No branch occlusion, beading, or stenosis.
Negative for aneurysm.
IMPRESSION: 1. No acute finding.
2. Remote left thalamus infarct.
3. Variant circle-of-Willis without superimposed abnormality.

## 2019-04-08 IMAGING — CT CT T SPINE W/O CM
3 of 6 series · 8 of 33 positions shown, 9 images · non-contrast
Comparison: Prior radiograph from 02/07/2014.

CLINICAL DATA: Initial evaluation for mid back and thoracic spine
pain.

EXAM:
CT CERVICAL SPINE WITHOUT CONTRAST
CT THORACIC SPINE WITHOUT CONTRAST
TECHNIQUE: Multidetector CT imaging of the cervical and thoracic spine was
performed without contrast. Multiplanar CT image reconstructions
were also generated.

[Series 4: t spine soft · axial · 0.29mm/px · z∈[-434,-434]mm · 1 of 172 slices shown, 2 images]
[im 94/172  soft-tissue]
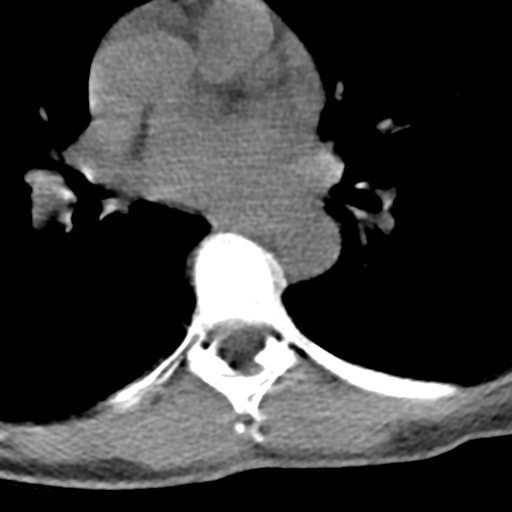
[im 94/172  bone]
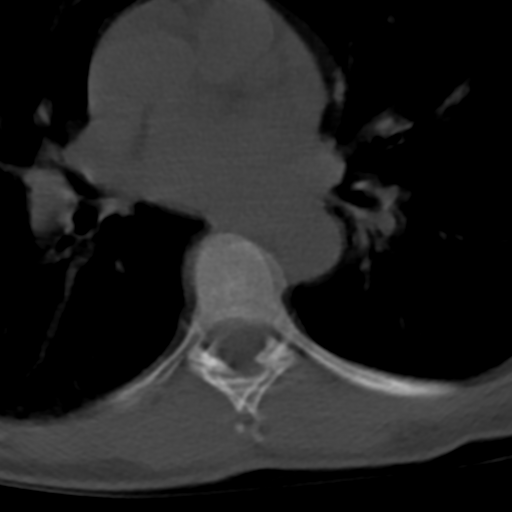

[Series 7: sagittal bone · sagittal · 0.29mm/px · 5 of 64 slices shown]
[im 11/64  bone]
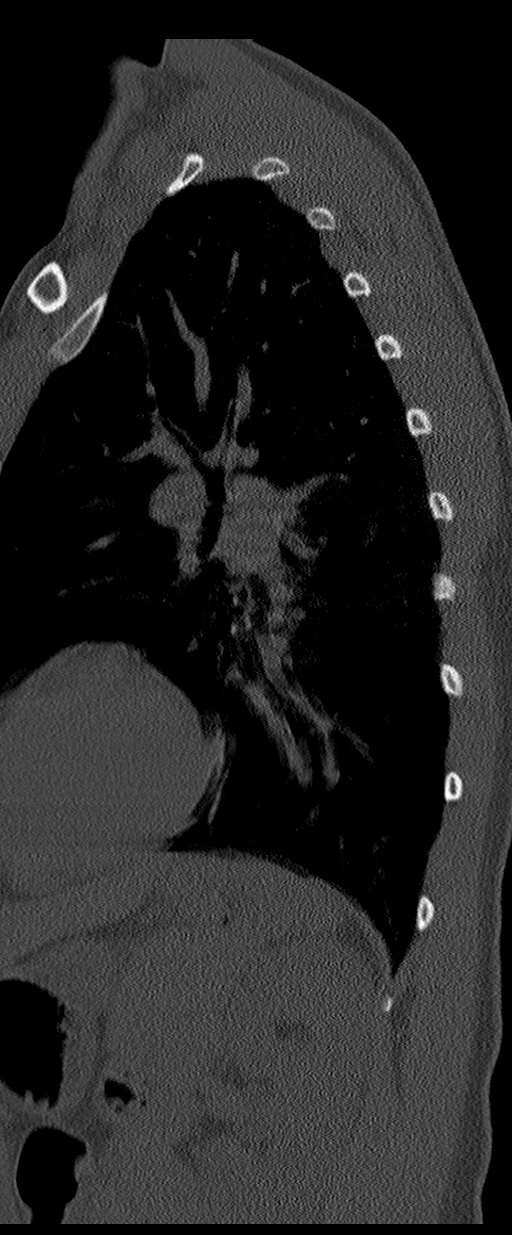
[im 22/64  bone]
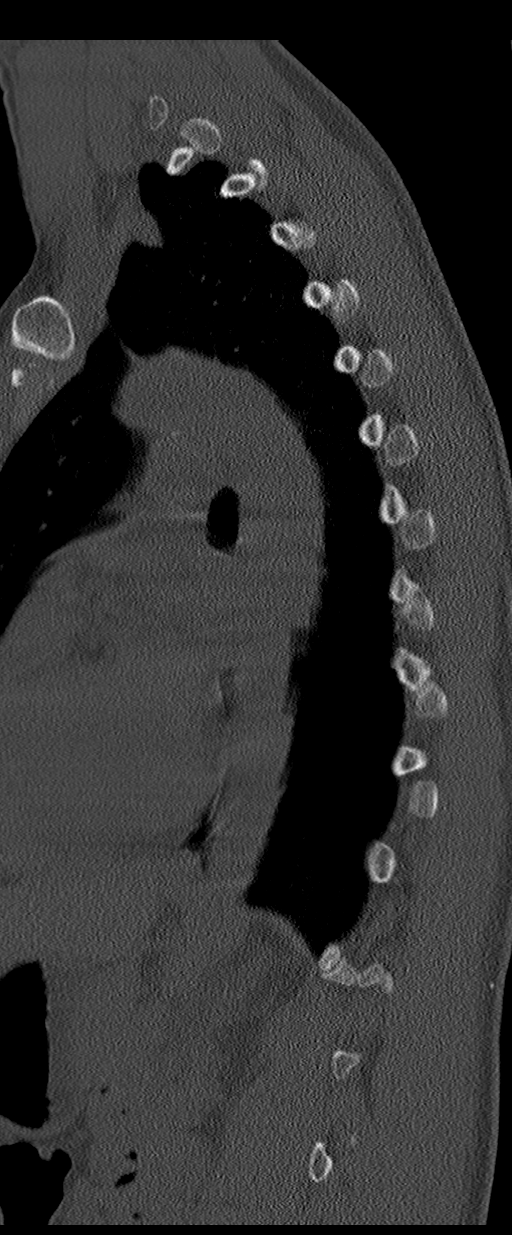
[im 32/64  bone]
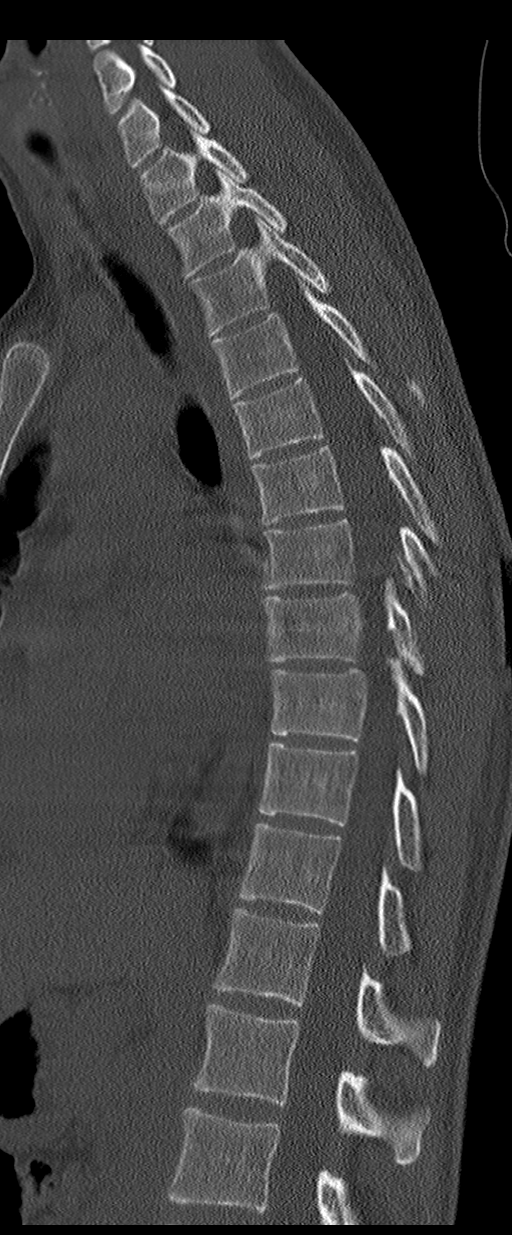
[im 43/64  bone]
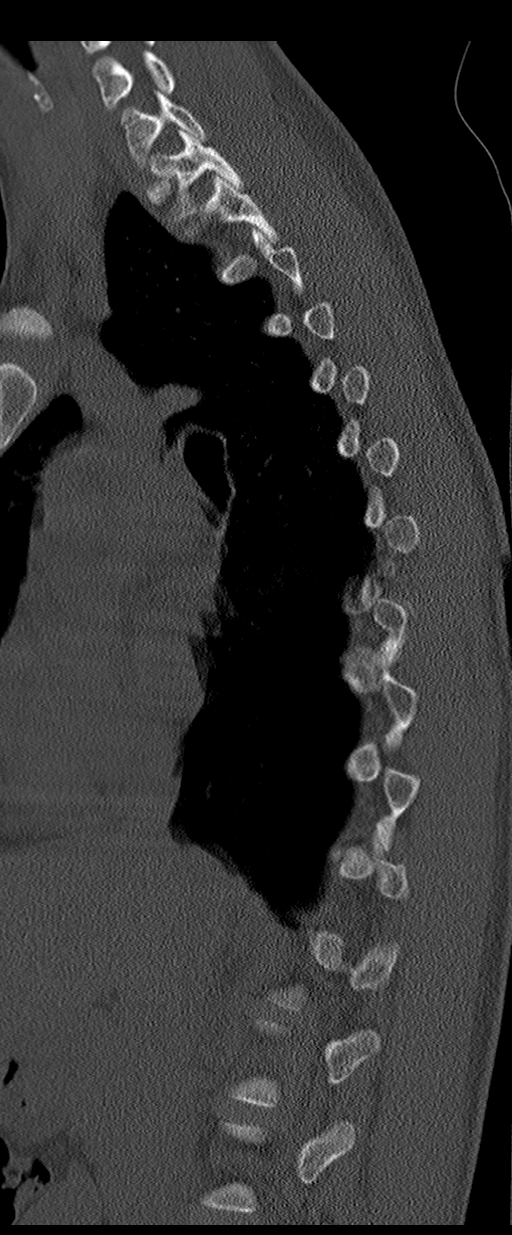
[im 53/64  bone]
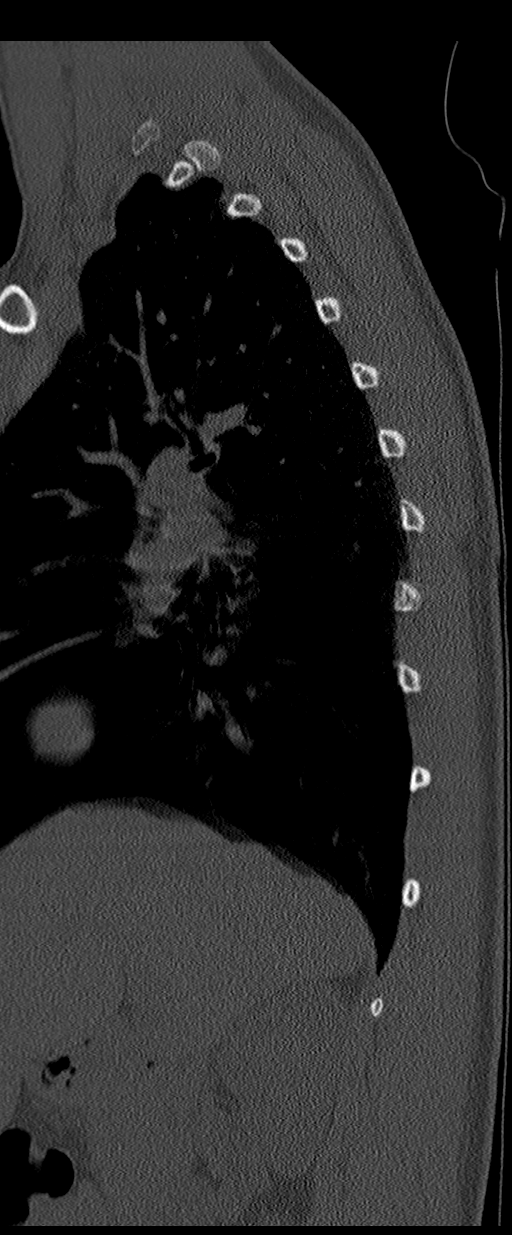

[Series 9: coronal bone · coronal · 0.25mm/px · 2 of 75 slices shown]
[im 25/75  bone]
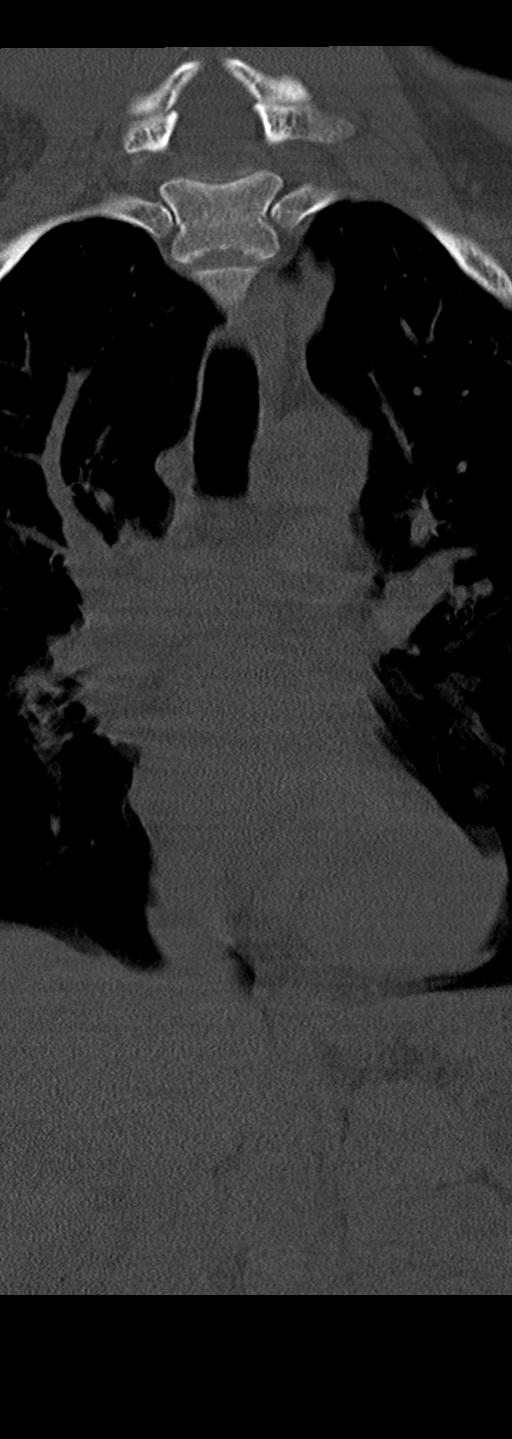
[im 50/75  bone]
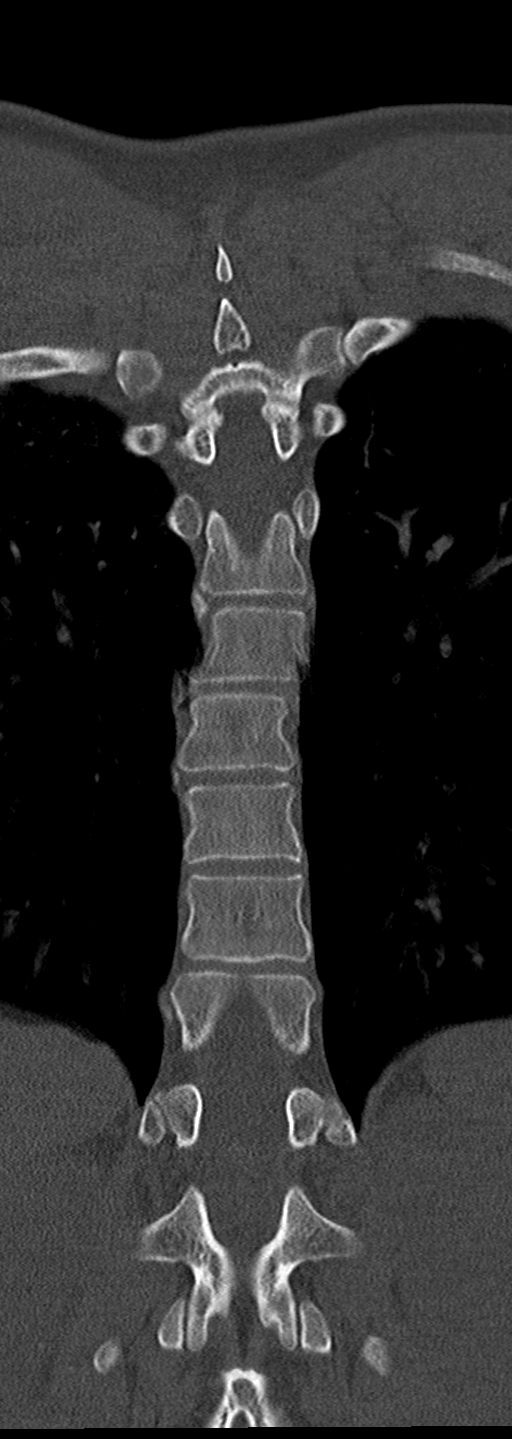

[8 of 33 positions shown; findings below may reference images not displayed]

FINDINGS: CT CERVICAL SPINE FINDINGS

Alignment: Straightening of the normal cervical lordosis. No
listhesis.

Skull base and vertebrae: Skull base intact. Normal C1-2
articulations are preserved in the dens is intact. Vertebral body
heights well maintained. No acute fracture.

Soft tissues and spinal canal: Soft tissues of the neck demonstrate
no acute abnormality. No abnormal prevertebral edema. Spinal canal
within normal limits.

Disc levels: No significant degenerative changes within the cervical
spine.

Upper chest: Visualized upper chest unremarkable. Visualized lungs
are grossly clear.

Other: None.

CT THORACIC SPINE FINDINGS

Alignment: Examination technically limited by motion artifact.

Vertebral bodies normally aligned with preservation of the normal
thoracic kyphosis. No listhesis or malalignment.

Vertebrae: Vertebral body heights maintained without evidence for
acute or chronic fracture. No discrete lytic or blastic osseous
lesions.

Paraspinal and other soft tissues: Paraspinous soft tissues
demonstrate no acute abnormality. Partially visualized lungs are
grossly clear.

Disc levels: No significant degenerative changes seen within the
thoracic spine. No appreciable disc protrusion or significant disc
bulge. No significant canal or foraminal stenosis. No significant
facet degeneration.
IMPRESSION: Normal CT of the cervical and thoracic spine. No acute traumatic
injury identified. No significant degenerative change or stenosis.

## 2019-06-25 ENCOUNTER — Encounter
Admission: EM | Disposition: A | Discharge status: Home / Self Care | Payer: Self-pay | Source: Home / Self Care | Attending: Emergency Medicine

## 2019-06-25 ENCOUNTER — Other Ambulatory Visit: Payer: Self-pay

## 2019-06-25 ENCOUNTER — Ambulatory Visit
Admission: EM | Admit: 2019-06-25 | Discharge: 2019-06-26 | Disposition: A | Discharge status: Home / Self Care | Payer: Medicare Other | Attending: Obstetrics & Gynecology | Admitting: Obstetrics & Gynecology

## 2019-06-25 ENCOUNTER — Emergency Department: Payer: Medicare Other

## 2019-06-25 ENCOUNTER — Encounter: Payer: Self-pay | Admitting: *Deleted

## 2019-06-25 DIAGNOSIS — G43909 Migraine, unspecified, not intractable, without status migrainosus: Secondary | ICD-10-CM | POA: Diagnosis not present

## 2019-06-25 DIAGNOSIS — Z91030 Bee allergy status: Secondary | ICD-10-CM | POA: Insufficient documentation

## 2019-06-25 DIAGNOSIS — O00101 Right tubal pregnancy without intrauterine pregnancy: Secondary | ICD-10-CM | POA: Insufficient documentation

## 2019-06-25 DIAGNOSIS — R1032 Left lower quadrant pain: Secondary | ICD-10-CM | POA: Diagnosis present

## 2019-06-25 DIAGNOSIS — F17210 Nicotine dependence, cigarettes, uncomplicated: Secondary | ICD-10-CM | POA: Diagnosis not present

## 2019-06-25 DIAGNOSIS — Z885 Allergy status to narcotic agent status: Secondary | ICD-10-CM | POA: Diagnosis not present

## 2019-06-25 DIAGNOSIS — Z880 Allergy status to penicillin: Secondary | ICD-10-CM | POA: Insufficient documentation

## 2019-06-25 DIAGNOSIS — O99330 Smoking (tobacco) complicating pregnancy, unspecified trimester: Secondary | ICD-10-CM | POA: Diagnosis not present

## 2019-06-25 DIAGNOSIS — O0000 Abdominal pregnancy without intrauterine pregnancy: Secondary | ICD-10-CM

## 2019-06-25 DIAGNOSIS — Z791 Long term (current) use of non-steroidal anti-inflammatories (NSAID): Secondary | ICD-10-CM | POA: Diagnosis not present

## 2019-06-25 DIAGNOSIS — I69320 Aphasia following cerebral infarction: Secondary | ICD-10-CM | POA: Insufficient documentation

## 2019-06-25 DIAGNOSIS — O99320 Drug use complicating pregnancy, unspecified trimester: Secondary | ICD-10-CM | POA: Insufficient documentation

## 2019-06-25 DIAGNOSIS — Z20828 Contact with and (suspected) exposure to other viral communicable diseases: Secondary | ICD-10-CM | POA: Diagnosis not present

## 2019-06-25 DIAGNOSIS — F1510 Other stimulant abuse, uncomplicated: Secondary | ICD-10-CM | POA: Insufficient documentation

## 2019-06-25 DIAGNOSIS — Z79899 Other long term (current) drug therapy: Secondary | ICD-10-CM | POA: Diagnosis not present

## 2019-06-25 DIAGNOSIS — R569 Unspecified convulsions: Secondary | ICD-10-CM | POA: Insufficient documentation

## 2019-06-25 DIAGNOSIS — F1410 Cocaine abuse, uncomplicated: Secondary | ICD-10-CM | POA: Diagnosis not present

## 2019-06-25 DIAGNOSIS — Z7982 Long term (current) use of aspirin: Secondary | ICD-10-CM | POA: Diagnosis not present

## 2019-06-25 DIAGNOSIS — Z3A00 Weeks of gestation of pregnancy not specified: Secondary | ICD-10-CM | POA: Diagnosis not present

## 2019-06-25 HISTORY — PX: DIAGNOSTIC LAPAROSCOPY WITH REMOVAL OF ECTOPIC PREGNANCY: SHX6449

## 2019-06-25 HISTORY — PX: LAPAROSCOPIC UNILATERAL SALPINGECTOMY: SHX5934

## 2019-06-25 HISTORY — PX: LAPAROSCOPIC LYSIS OF ADHESIONS: SHX5905

## 2019-06-25 LAB — CBC
HCT: 34.5 % — ABNORMAL LOW (ref 36.0–46.0)
Hemoglobin: 11.9 g/dL — ABNORMAL LOW (ref 12.0–15.0)
MCH: 28.3 pg (ref 26.0–34.0)
MCHC: 34.5 g/dL (ref 30.0–36.0)
MCV: 82.1 fL (ref 80.0–100.0)
Platelets: 374 10*3/uL (ref 150–400)
RBC: 4.2 MIL/uL (ref 3.87–5.11)
RDW: 15.2 % (ref 11.5–15.5)
WBC: 13.6 10*3/uL — ABNORMAL HIGH (ref 4.0–10.5)
nRBC: 0 % (ref 0.0–0.2)

## 2019-06-25 LAB — COMPREHENSIVE METABOLIC PANEL
ALT: 20 U/L (ref 0–44)
AST: 22 U/L (ref 15–41)
Albumin: 4.2 g/dL (ref 3.5–5.0)
Alkaline Phosphatase: 54 U/L (ref 38–126)
Anion gap: 10 (ref 5–15)
BUN: 13 mg/dL (ref 6–20)
CO2: 24 mmol/L (ref 22–32)
Calcium: 9.2 mg/dL (ref 8.9–10.3)
Chloride: 103 mmol/L (ref 98–111)
Creatinine, Ser: 0.57 mg/dL (ref 0.44–1.00)
GFR calc Af Amer: >60 mL/min (ref >60)
GFR calc non Af Amer: >60 mL/min (ref >60)
Glucose, Bld: 95 mg/dL (ref 70–99)
Potassium: 3.5 mmol/L (ref 3.5–5.1)
Sodium: 137 mmol/L (ref 135–145)
Total Bilirubin: 0.6 mg/dL (ref 0.3–1.2)
Total Protein: 7.9 g/dL (ref 6.5–8.1)

## 2019-06-25 LAB — POCT PREGNANCY, URINE: Preg Test, Ur: POSITIVE — AB

## 2019-06-25 LAB — LIPASE, BLOOD: Lipase: 80 U/L — ABNORMAL HIGH (ref 11–51)

## 2019-06-25 LAB — RESPIRATORY PANEL BY RT PCR (FLU A&B, COVID)
Influenza A by PCR: NEGATIVE
Influenza B by PCR: NEGATIVE
SARS Coronavirus 2 by RT PCR: NEGATIVE

## 2019-06-25 LAB — HCG, QUANTITATIVE, PREGNANCY: hCG, Beta Chain, Quant, S: 1565 m[IU]/mL — ABNORMAL HIGH (ref <5)

## 2019-06-25 SURGERY — LAPAROSCOPY, WITH ECTOPIC PREGNANCY SURGICAL TREATMENT
Anesthesia: General | Laterality: Right

## 2019-06-25 MED ORDER — MIDAZOLAM HCL 2 MG/2ML IJ SOLN
INTRAMUSCULAR | Status: AC
  Filled 2019-06-25: qty 2

## 2019-06-25 MED ORDER — LORAZEPAM 2 MG/ML IJ SOLN: 1.0000 mg | Freq: Once | INTRAMUSCULAR | Status: DC

## 2019-06-25 MED ORDER — SODIUM CHLORIDE 0.9 % IV BOLUS
1000.0000 mL | Freq: Once | INTRAVENOUS | Status: AC
  Administered 2019-06-25: 17:00:00 1000 mL via INTRAVENOUS

## 2019-06-25 MED ORDER — MORPHINE SULFATE (PF) 4 MG/ML IV SOLN
4.0000 mg | Freq: Once | INTRAVENOUS | Status: AC
  Administered 2019-06-25: 17:00:00 4 mg via INTRAVENOUS
  Filled 2019-06-25: qty 1

## 2019-06-25 MED ORDER — LORAZEPAM 2 MG/ML IJ SOLN
1.0000 mg | Freq: Once | INTRAMUSCULAR | Status: AC
  Administered 2019-06-25: 17:00:00 1 mg via INTRAVENOUS
  Filled 2019-06-25: qty 1

## 2019-06-25 MED ORDER — NICOTINE 14 MG/24HR TD PT24
14.0000 mg | MEDICATED_PATCH | Freq: Once | TRANSDERMAL | Status: DC
  Administered 2019-06-25: 20:00:00 14 mg via TRANSDERMAL
  Filled 2019-06-25: qty 1

## 2019-06-25 MED ORDER — ONDANSETRON 8 MG PO TBDP
8.0000 mg | ORAL_TABLET | Freq: Once | ORAL | Status: AC
  Administered 2019-06-25: 17:00:00 8 mg via ORAL
  Filled 2019-06-25: qty 1

## 2019-06-25 MED ORDER — FENTANYL CITRATE (PF) 100 MCG/2ML IJ SOLN
INTRAMUSCULAR | Status: AC
  Filled 2019-06-25: qty 2

## 2019-06-25 MED ORDER — LACTATED RINGERS IV SOLN
INTRAVENOUS | Status: DC
  Administered 2019-06-26: via INTRAVENOUS

## 2019-06-25 SURGICAL SUPPLY — 38 items
BLADE SURG SZ11 CARB STEEL (BLADE) ×4 IMPLANT
CANISTER SUCT 1200ML W/VALVE (MISCELLANEOUS) ×4 IMPLANT
CATH ROBINSON RED A/P 16FR (CATHETERS) ×4 IMPLANT
CHLORAPREP W/TINT 26 (MISCELLANEOUS) ×4 IMPLANT
COVER WAND RF STERILE (DRAPES) ×4 IMPLANT
DERMABOND ADVANCED (GAUZE/BANDAGES/DRESSINGS) ×2
DERMABOND ADVANCED .7 DNX12 (GAUZE/BANDAGES/DRESSINGS) ×2 IMPLANT
DRSG TELFA 4X3 1S NADH ST (GAUZE/BANDAGES/DRESSINGS) IMPLANT
GLOVE BIO SURGEON STRL SZ8 (GLOVE) ×4 IMPLANT
GLOVE INDICATOR 8.0 STRL GRN (GLOVE) ×4 IMPLANT
GOWN STRL REUS W/ TWL LRG LVL3 (GOWN DISPOSABLE) ×2 IMPLANT
GOWN STRL REUS W/ TWL XL LVL3 (GOWN DISPOSABLE) ×2 IMPLANT
GOWN STRL REUS W/TWL LRG LVL3 (GOWN DISPOSABLE) ×2
GOWN STRL REUS W/TWL XL LVL3 (GOWN DISPOSABLE) ×2
GRASPER SUT TROCAR 14GX15 (MISCELLANEOUS) ×4 IMPLANT
IRRIGATION STRYKERFLOW (MISCELLANEOUS) ×2 IMPLANT
IRRIGATOR STRYKERFLOW (MISCELLANEOUS) ×4
IV LACTATED RINGERS 1000ML (IV SOLUTION) ×4 IMPLANT
KIT PINK PAD W/HEAD ARE REST (MISCELLANEOUS) ×4
KIT PINK PAD W/HEAD ARM REST (MISCELLANEOUS) ×2 IMPLANT
LABEL OR SOLS (LABEL) ×4 IMPLANT
NEEDLE VERESS 14GA 120MM (NEEDLE) ×4 IMPLANT
NS IRRIG 500ML POUR BTL (IV SOLUTION) ×4 IMPLANT
PACK GYN LAPAROSCOPIC (MISCELLANEOUS) ×4 IMPLANT
PAD PREP 24X41 OB/GYN DISP (PERSONAL CARE ITEMS) ×4 IMPLANT
POUCH SPECIMEN RETRIEVAL 10MM (ENDOMECHANICALS) IMPLANT
SCISSORS METZENBAUM CVD 33 (INSTRUMENTS) ×4 IMPLANT
SET TUBE SMOKE EVAC HIGH FLOW (TUBING) ×4 IMPLANT
SHEARS HARMONIC ACE PLUS 36CM (ENDOMECHANICALS) ×4 IMPLANT
SLEEVE ENDOPATH XCEL 5M (ENDOMECHANICALS) IMPLANT
SPONGE GAUZE 2X2 8PLY STER LF (GAUZE/BANDAGES/DRESSINGS)
SPONGE GAUZE 2X2 8PLY STRL LF (GAUZE/BANDAGES/DRESSINGS) IMPLANT
STRAP SAFETY 5IN WIDE (MISCELLANEOUS) ×4 IMPLANT
SUT VIC AB 2-0 UR6 27 (SUTURE) ×4 IMPLANT
SUT VIC AB 4-0 PS2 18 (SUTURE) ×4 IMPLANT
SYR 10ML LL (SYRINGE) ×4 IMPLANT
TROCAR ENDO BLADELESS 11MM (ENDOMECHANICALS) ×4 IMPLANT
TROCAR XCEL NON-BLD 5MMX100MML (ENDOMECHANICALS) ×4 IMPLANT

## 2019-06-25 NOTE — Anesthesia Preprocedure Evaluation (Addendum)
Anesthesia Evaluation  Patient identified by MRN, date of birth, ID band Patient awake    Reviewed: Allergy & Precautions, NPO status , Patient's Chart, lab work & pertinent test results  Airway Mallampati: II       Dental  (+) Poor Dentition, Chipped Extensive peridontal disease:   Pulmonary Current Smoker,    Pulmonary exam normal        Cardiovascular negative cardio ROS Normal cardiovascular exam     Neuro/Psych  Headaches, Seizures -,  PSYCHIATRIC DISORDERS CVA    GI/Hepatic (+)     substance abuse  cocaine use,   Endo/Other  negative endocrine ROS  Renal/GU negative Renal ROS  Female GU complaint     Musculoskeletal negative musculoskeletal ROS (+)   Abdominal Normal abdominal exam  (+)   Peds negative pediatric ROS (+)  Hematology negative hematology ROS (+)   Anesthesia Other Findings   Reproductive/Obstetrics                            Anesthesia Physical Anesthesia Plan  ASA: III and emergent  Anesthesia Plan: General   Post-op Pain Management:    Induction: Intravenous, Rapid sequence and Cricoid pressure planned  PONV Risk Score and Plan:   Airway Management Planned: Oral ETT  Additional Equipment:   Intra-op Plan:   Post-operative Plan: Extubation in OR  Informed Consent: I have reviewed the patients History and Physical, chart, labs and discussed the procedure including the risks, benefits and alternatives for the proposed anesthesia with the patient or authorized representative who has indicated his/her understanding and acceptance.     Dental advisory given  Plan Discussed with: CRNA and Surgeon  Anesthesia Plan Comments:         Anesthesia Quick Evaluation

## 2019-06-25 NOTE — ED Notes (Signed)
Pt refusing swab until "I talk to someone about this"  Pt upset that she has eaten today

## 2019-06-25 NOTE — ED Provider Notes (Signed)
[facility] Emergency Department Provider Note  ____________________________________________   First MD Initiated Contact with Patient 06/25/19 1600     (approximate)  I have reviewed the triage vital signs and the nursing notes.   HISTORY  Chief Complaint Abdominal Pain    HPI Sheri Woods is a 33 y.o. female presents emergency department complaining of left lower quadrant pain.  She was at the GYN office earlier and they did a urine test which showed she was pregnant.  She is very surprised as she had her tubes tied 11 years ago.  She is experienced left lower quadrant pain for about 2 weeks.  Unbearable  for the last few days.  She states she feels bloated in this area.  Area is very sore.  She states she did miss her.  She states she has about every 2 months but did not have one this time.  No vaginal discharge.  Same sexual partner for the last 9 to 10 months.  She denies any vaginal discharge or UTI symptoms.  No fever or chills.   Past Medical History:  Diagnosis Date  . Head trauma   . Headache(784.0)   . Seizures (HCC)   . Stroke [facility])     Patient Active Problem List   Diagnosis Date Noted  . Aphasia 11/10/2017  . CVA (cerebral infarction) 11/16/2012  . Cocaine abuse (HCC) 11/16/2012  . 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 Altered mental status 11/15/2012  . Abnormal CT scan of head 11/15/2012  . Encephalopathy acute 11/15/2012  . 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 Migraine headache 11/15/2012    Past Surgical History:  Procedure Laterality Date  . CESAREAN SECTION     X 4  . TEE WITHOUT CARDIOVERSION N/A 11/17/2012   Procedure: TRANSESOPHAGEAL ECHOCARDIOGRAM (TEE);  Surgeon: Dolores Patty, MD;  Location: [facility] ENDOSCOPY;  Service: Cardiovascular;  Laterality: N/A;    Prior to Admission medications   Medication Sig Start Date End Date Taking? Authorizing Provider  aspirin 81 MG chewable tablet Chew 1 tablet (81 mg total) by mouth  daily. 11/11/17   Katha Hamming, MD  lisinopril (PRINIVIL,ZESTRIL) 10 MG tablet Take 1 tablet (10 mg total) by mouth daily. 11/11/17   Katha Hamming, MD  naproxen (NAPROSYN) 500 MG tablet Take 1 tablet (500 mg total) by mouth 2 (two) times daily with a meal. 10/10/18   Jene Every, MD  ondansetron (ZOFRAN ODT) 4 MG disintegrating tablet Take 1 tablet (4 mg total) by mouth every 8 (eight) hours as needed for nausea or vomiting. 03/06/18   Don Perking, [place], MD  sertraline (ZOLOFT) 50 MG tablet Take 1 tablet (50 mg total) by mouth at bedtime. 11/11/17   Katha Hamming, MD  traMADol (ULTRAM) 50 MG tablet Take 1 tablet (50 mg total) by mouth every 12 (twelve) hours as needed. 01/28/18   Joni Reining, PA-C    Allergies Bee venom, Hydrocodone, and Penicillins  History reviewed. No pertinent family history.  Social History Social History   Tobacco Use  . Smoking status: Current Every Day Smoker    Packs/day: 0.50    Years: 15.00    Pack years: 7.50    Types: Cigarettes  . Smokeless tobacco: Never Used  Substance Use Topics  . Alcohol use: No  . Drug use: Yes    Types: Cocaine    Comment: last used within the last month    Review of Systems  Constitutional: No fever/chills Eyes: No visual changes. ENT: No sore throat. Respiratory:  Denies cough Gastrointestinal: Positive for left lower quadrant pain Genitourinary: Negative for dysuria. Musculoskeletal: Negative for back pain. Skin: Negative for rash.    ____________________________________________   PHYSICAL EXAM:  VITAL SIGNS: ED Triage Vitals  Enc Vitals Group     BP --      Pulse --      Resp --      Temp --      Temp src --      SpO2 --      Weight 06/25/19 1345 120 lb (54.4 kg)     Height 06/25/19 1345 5\' 3"  (1.6 m)     Head Circumference --      Peak Flow --      Pain Score 06/25/19 1344 9     Pain Loc --      Pain Edu? --      Excl. in [place]? --     Constitutional: Alert and oriented.  Well appearing and in no acute distress. Eyes: Conjunctivae are normal.  Head: Atraumatic. Nose: No congestion/rhinnorhea. Mouth/Throat: Mucous membranes are moist.   Neck:  supple no lymphadenopathy noted Cardiovascular: Normal rate, regular rhythm. Heart sounds are normal Respiratory: Normal respiratory effort.  No retractions, lungs c t a  Abd: soft tender in the left lower quadrant, patient is tearful upon palpation, bs normal all 4 quad GU: deferred Musculoskeletal: FROM all extremities, warm and well perfused Neurologic:  Normal speech and language.  Skin:  Skin is warm, dry and intact. No rash noted. Psychiatric: Mood and affect are normal. Speech and behavior are normal.  ____________________________________________   LABS (all labs ordered are listed, but only abnormal results are displayed)  Labs Reviewed  LIPASE, BLOOD - Abnormal; Notable for the following components:      Result Value   Lipase 80 (*)    All other components within normal limits  CBC - Abnormal; Notable for the following components:   WBC 13.6 (*)    Hemoglobin 11.9 (*)    HCT 34.5 (*)    All other components within normal limits  HCG, QUANTITATIVE, PREGNANCY - Abnormal; Notable for the following components:   hCG, Beta Chain, Quant, S 1,565 (*)    All other components within normal limits  POCT PREGNANCY, URINE - Abnormal; Notable for the following components:   Preg Test, Ur POSITIVE (*)    All other components within normal limits  RESPIRATORY PANEL BY RT PCR (FLU A&B, COVID)  COMPREHENSIVE METABOLIC PANEL  POC URINE PREG, ED  TYPE AND SCREEN  TYPE AND SCREEN  TYPE AND SCREEN   ____________________________________________   ____________________________________________  RADIOLOGY  Ultrasound does not show an IUP, it indicates a pregnancy of unknown etiology.  ____________________________________________   PROCEDURES  Procedure(s) performed: Saline lock, Ativan 1 mg IV, morphine 4  mg IV, Zofran 4 mg IV, normal saline 1 L   Procedures    ____________________________________________   INITIAL IMPRESSION / ASSESSMENT AND PLAN / ED COURSE  Pertinent labs & imaging results that were available during my care of the patient were reviewed by me and considered in my medical decision making (see chart for details).   Patient is 33 year old female with history of tubal ligation 11 years ago presenting to the emergency department with a positive pregnancy test and left lower quadrant pain.  See HPI  Physical exam shows patient is very tearful, anxious, left lower quadrant is very tender to palpation.  She becomes more tearful upon exam.  POC pregnancy positive, beta  is 1565, lipase elevated 80, CBC with elevated WBC of 13.6  Ultrasound did not see an IUP, indicates a pregnancy of unknown etiology.  Discussed the case with Dr. Alfred LevinsFunk and Dr. Colon BranchMonks, due to the patient's amount of pain will order MRI.  Discussed this with Dr. Tiburcio PeaHarris from OB/GYN, he said most likely she will end up needing a laparoscopic exploration per his standpoint and does not need anything versus the MRI.  MRI shows fluid in the pelvis.  No explanation for the elevated lipase and otherwise bloating of the abdomen.  Dr. Manson PasseyBrown called Dr. Tiburcio PeaHarris.  Dr. Tiburcio PeaHarris to be coming in see the patient for laparoscopic surgery.  Covid test ordered.   Sheri Woods was evaluated in Emergency Department on 06/25/2019 for the symptoms described in the history of present illness. She was evaluated in the context of the global COVID-19 pandemic, which necessitated consideration that the patient might be at risk for infection with the SARS-CoV-2 virus that causes COVID-19. Institutional protocols and algorithms that pertain to the evaluation of patients at risk for COVID-19 are in a state of rapid change based on information released by regulatory bodies including the CDC and federal and state organizations. These policies and  algorithms were followed during the patient's care in the ED.   As part of my medical decision making, I reviewed the following data within the electronic MEDICAL RECORD NUMBER Nursing notes reviewed and incorporated, Labs reviewed , Old chart reviewed, Radiograph reviewed , A consult was requested and obtained from this/these consultant(s) OB/GYN, Evaluated by EM attending Dr. Fuller PlanFunke, Notes from prior ED visits and [place] Controlled Substance Database  ____________________________________________   FINAL CLINICAL IMPRESSION(S) / ED DIAGNOSES  Final diagnoses:  Abdominal pregnancy without intrauterine pregnancy      NEW MEDICATIONS STARTED DURING THIS VISIT:  New Prescriptions   No medications on file     Note:  This document was prepared using Dragon voice recognition software and may include unintentional dictation errors.    Faythe GheeFisher, [place] Be[place] W, PA-C 06/25/19 2053    Concha SeFunke, Mary E, MD 06/26/19 1700

## 2019-06-25 NOTE — H&P (Signed)
Obstetrics & Gynecology History and Physical Note  Date of Consultation: 06/25/2019   Requesting Provider: [facility]  Primary OBGYN: None [facility]  Reason for Consultation: 2 weeks pelvic pain  History of Present Illness: [name] is a 33 y.o. G5P5 (Patient's last menstrual period was 04/28/2019.), with the above CC. She has had mostly LLQ pain with associated nausea, no vaginal bleeding; LMP was about 2 mos ago; she has irreg periods and doesn't track them anymore; Tubes were tied 11 years ago after her 4th CS and fifth delivery overall.  She was planning tubal reversal soon for another pregnancy attempt.   Pain is severe at times, radiation over towards LLQ, associated w nausea, no modifiers.  ROS: A review of systems was performed and was complete and comprehensive, except as stated in the above HPI.  Pt is a smoker.  OBGYN History: As per HPI.   Past Medical History: Past Medical History:  Diagnosis Date  . Head trauma   . Headache(784.0)   . Seizures (HCC)   . Stroke [facility](HCC)     Past Surgical History: Past Surgical History:  Procedure Laterality Date  . CESAREAN SECTION     X 4  . TEE WITHOUT CARDIOVERSION N/A 11/17/2012   Procedure: TRANSESOPHAGEAL ECHOCARDIOGRAM (TEE);  Surgeon: Dolores Pattyaniel R Bensimhon, MD;  [facility];  Service: Cardiovascular;  Laterality: N/A;    Family History:  History reviewed. No pertinent family history. She denies any female cancers, bleeding or blood clotting disorders.   Social History:  Social History   Socioeconomic History  . Marital status: Married    Spouse name: Not on file  . Number of children: Not on file  . Years of education: Not on file  . Highest education level: Not on file  Occupational History  . Not on file  Tobacco Use  . Smoking status: Current Every Day Smoker    Packs/day: 0.50    Years: 15.00    Pack years: 7.50    Types: Cigarettes  . Smokeless  tobacco: Never Used  Substance and Sexual Activity  . Alcohol use: No  . Drug use: Yes    Types: Cocaine    Comment: last used within the last month  . Sexual activity: Yes  Other Topics Concern  . Not on file  Social History Narrative  . Not on file   Social Determinants of [place]   Financial Resource Strain:   . Difficulty of Paying Living Expenses: Not on file  Food Insecurity:   . Worried About Programme researcher, broadcasting/film/videounning Out of Food in the Last Year: Not on file  . Ran Out of Food in the Last Year: Not on file  Transportation Needs:   . Lack of Transportation (Medical): Not on file  . Lack of Transportation (Non-Medical): Not on file  Physical Activity:   . Days of Exercise per Week: Not on file  . Minutes of Exercise per Session: Not on file  Stress:   . Feeling of Stress : Not on file  Social Connections:   . Frequency of Communication with Friends and Family: Not on file  . Frequency of Social Gatherings with Friends and Family: Not on file  . Attends Religious Services: Not on file  . Active Member of Clubs or Organizations: Not on file  . Attends BankerClub or Organization Meetings: Not on file  . Marital Status: Not on file  Intimate Partner Violence:   . Fear of Current or Ex-Partner:  Not on file  . Emotionally Abused: Not on file  . Physically Abused: Not on file  . Sexually Abused: Not on file    Allergy: Allergies  Allergen Reactions  . Bee Venom Anaphylaxis  . Hydrocodone Hives  . Penicillins Hives and Other (See Comments)    Has patient had a PCN reaction causing immediate rash, facial/tongue/throat swelling, SOB or lightheadedness with hypotension: No Has patient had a PCN reaction causing severe rash involving mucus membranes or skin necrosis: No Has patient had a PCN reaction that required hospitalization No Has patient had a PCN reaction occurring within the last 10 years: No If all of the above answers are "NO", then may proceed with Cephalosporin use.    Current  Outpatient Medications: (Not in a hospital admission)   Hospital Medications: Current Facility-Administered Medications  Medication Dose Route Frequency Provider Last Rate Last Admin  . LORazepam (ATIVAN) injection 1 mg  1 mg Intravenous Once Gae Dry, MD      . nicotine (NICODERM CQ - dosed in mg/24 hours) patch 14 mg  14 mg Transdermal Once Versie Starks, PA-C   14 mg at 06/25/19 2029   Current Outpatient Medications  Medication Sig Dispense Refill  . aspirin 81 MG chewable tablet Chew 1 tablet (81 mg total) by mouth daily. 30 tablet 0  . lisinopril (PRINIVIL,ZESTRIL) 10 MG tablet Take 1 tablet (10 mg total) by mouth daily. 30 tablet 0  . naproxen (NAPROSYN) 500 MG tablet Take 1 tablet (500 mg total) by mouth 2 (two) times daily with a meal. 20 tablet 2  . ondansetron (ZOFRAN ODT) 4 MG disintegrating tablet Take 1 tablet (4 mg total) by mouth every 8 (eight) hours as needed for nausea or vomiting. 20 tablet 0  . sertraline (ZOLOFT) 50 MG tablet Take 1 tablet (50 mg total) by mouth at bedtime. 30 tablet 0  . traMADol (ULTRAM) 50 MG tablet Take 1 tablet (50 mg total) by mouth every 12 (twelve) hours as needed. 12 tablet 0    Physical Exam: Vitals:   06/25/19 1345 06/25/19 1645 06/25/19 1921 06/25/19 2119  BP:  140/78 (!) 141/92 (!) 153/99  Pulse:  78 80 79  Resp:  18 20 16   Temp:  98.4 F (36.9 C) 98.5 F (36.9 C) 98.2 F (36.8 C)  TempSrc:  Oral Oral Oral  SpO2:  100% 100% 100%  Weight: 54.4 kg     Height: 5\' 3"  (1.6 m)       Temp:  [98.2 F (36.8 C)-98.5 F (36.9 C)] 98.2 F (36.8 C) (12/11 2119) Pulse Rate:  [78-80] 79 (12/11 2119) Resp:  [16-20] 16 (12/11 2119) BP: (140-153)/(78-99) 153/99 (12/11 2119) SpO2:  [100 %] 100 % (12/11 2119) Weight:  [54.4 kg] 54.4 kg (12/11 1345) No intake/output data recorded. Total I/O In: 1000 [IV [id number]] Out: -   Intake/Output Summary (Last 24 hours) at 06/25/2019 2144 Last data filed at 06/25/2019 1923 Gross  per 24 hour  Intake 1000 ml  Output --  Net 1000 ml    Body mass index is 21.26 kg/m. Constitutional: Well nourished, well developed female in no acute distress.  HEENT: normal Neck:  Supple, normal appearance, and no thyromegaly  Cardiovascular:Regular rate and rhythm.  No murmurs, rubs or gallops. Respiratory:  Clear to auscultation bilateral. Normal respiratory effort Abdomen: positive bowel sounds and no masses, hernias; diffusely non tender to palpation, non distended Neuro: grossly intact Psych:  Normal mood and affect.  Skin:  Warm  and dry.  MS: normal gait and normal bilateral lower extremity strength/ROM/symmetry Lymphatic:  No inguinal lymphadenopathy.   Laboratory: Beta HCG: 1500  Recent Labs  Lab 06/25/19 1349  WBC 13.6*  HGB 11.9*  HCT 34.5*  PLT 374   Recent Labs  Lab 06/25/19 1349  NA 137  K 3.5  CL 103  CO2 24  BUN 13  CREATININE 0.57  CALCIUM 9.2  PROT 7.9  BILITOT 0.6  ALKPHOS 54  ALT 20  AST 22  GLUCOSE 95    Recent Labs  Lab 06/25/19 1830  ABORH O NEG    Imaging:  Ultrasound independently reviewed/interpreted by self. No IUP.  No adnexal mass  MRI similar findings, possible fluid in endometrial canal, also mild enlargement of right tube  Assessment: Sheri Woods is a 33 y.o. G1P0 (Patient's last menstrual period was 04/28/2019.) who presented to the ED with complaints of pain; findings are consistent with tubal pregnancy d\ue to prior BTL, pain, and early pregnancy..  Plan: Diagnostic laparoscopy to see how tubes are and if ectopic is present; if so then salpingostomy to open tube and remove pregnancy, vs salpingectomy if tube severely damaged. If normal, then no further interventions, and then post post op w beta hCG levels to see if develops into normal IUP vs miscarriage.  The risks of surgery are discussed with the patient included but were not limited to: bleeding which may require transfusion or reoperation; infection which  may require antibiotics; injury to bowel, bladder, ureters or other surrounding organs;  need for additional procedures including hysterectomy in the event of a life-threatening hemorrhage; incisional problems, thromboembolic phenomenon and other postoperative/anesthesia complications. The patient concurred with the proposed plan, giving informed written consent for the procedure.   Annamarie Major, MD, [facility], [facility] 06/25/2019  9:44 PM Pager [phone]

## 2019-06-25 NOTE — ED Notes (Signed)
ED Provider at bedside. Dr Owens Shark explaining the [place]

## 2019-06-25 NOTE — ED Notes (Signed)
Pt to MRI

## 2019-06-25 NOTE — ED Triage Notes (Signed)
Abd pain x 1 month that has been unbearable for the past 2 weeks. Pt has had a positive pregnancy test but is sent to ED to rule out an ectopic pregnancy. No vaginal bleeding or discharge. Pain reported to be throughout her abd . No groin or back pain.

## 2019-06-25 NOTE — Anesthesia Post-op Follow-up Note (Signed)
Anesthesia QCDR form completed.        

## 2019-06-26 ENCOUNTER — Emergency Department: Payer: Medicare Other | Admitting: Certified Registered"

## 2019-06-26 DIAGNOSIS — O00101 Right tubal pregnancy without intrauterine pregnancy: Secondary | ICD-10-CM | POA: Diagnosis not present

## 2019-06-26 MED ORDER — FENTANYL CITRATE (PF) 100 MCG/2ML IJ SOLN
25.0000 ug | INTRAMUSCULAR | Status: DC | PRN
  Administered 2019-06-26 (×3): 25 ug via INTRAVENOUS

## 2019-06-26 MED ORDER — ONDANSETRON HCL 4 MG/2ML IJ SOLN
INTRAMUSCULAR | Status: DC | PRN
  Administered 2019-06-26: 4 mg via INTRAVENOUS

## 2019-06-26 MED ORDER — SUCCINYLCHOLINE CHLORIDE 20 MG/ML IJ SOLN
INTRAMUSCULAR | Status: DC | PRN
  Administered 2019-06-26: 100 mg via INTRAVENOUS

## 2019-06-26 MED ORDER — MORPHINE SULFATE (PF) 4 MG/ML IV SOLN: 1.0000 mg | INTRAVENOUS | Status: DC | PRN

## 2019-06-26 MED ORDER — LIDOCAINE HCL (CARDIAC) PF 100 MG/5ML IV SOSY
PREFILLED_SYRINGE | INTRAVENOUS | Status: DC | PRN
  Administered 2019-06-26: 60 mg via INTRAVENOUS

## 2019-06-26 MED ORDER — PROPOFOL 10 MG/ML IV BOLUS
INTRAVENOUS | Status: DC | PRN
  Administered 2019-06-26: 120 mg via INTRAVENOUS

## 2019-06-26 MED ORDER — SUGAMMADEX SODIUM 200 MG/2ML IV SOLN
INTRAVENOUS | Status: DC | PRN
  Administered 2019-06-26: 200 mg via INTRAVENOUS

## 2019-06-26 MED ORDER — OXYCODONE-ACETAMINOPHEN 5-325 MG PO TABS: 1.0000 | ORAL_TABLET | ORAL | 0 refills | Status: DC | PRN

## 2019-06-26 MED ORDER — ONDANSETRON HCL 4 MG/2ML IJ SOLN: 4.0000 mg | Freq: Once | INTRAMUSCULAR | Status: DC | PRN

## 2019-06-26 MED ORDER — BUPIVACAINE HCL (PF) 0.5 % IJ SOLN
INTRAMUSCULAR | Status: DC | PRN
  Administered 2019-06-26: 10 mL

## 2019-06-26 MED ORDER — ACETAMINOPHEN 650 MG RE SUPP
650.0000 mg | RECTAL | Status: DC | PRN
  Filled 2019-06-26: qty 1

## 2019-06-26 MED ORDER — OXYCODONE-ACETAMINOPHEN 5-325 MG PO TABS: 1.0000 | ORAL_TABLET | ORAL | Status: DC | PRN

## 2019-06-26 MED ORDER — OXYCODONE-ACETAMINOPHEN 5-325 MG PO TABS
ORAL_TABLET | ORAL | Status: AC
  Administered 2019-06-26: 1 via ORAL
  Filled 2019-06-26: qty 1

## 2019-06-26 MED ORDER — MIDAZOLAM HCL 2 MG/2ML IJ SOLN
INTRAMUSCULAR | Status: DC | PRN
  Administered 2019-06-26 (×2): 1 mg via INTRAVENOUS

## 2019-06-26 MED ORDER — DEXAMETHASONE SODIUM PHOSPHATE 10 MG/ML IJ SOLN
INTRAMUSCULAR | Status: DC | PRN
  Administered 2019-06-26: 10 mg via INTRAVENOUS

## 2019-06-26 MED ORDER — LACTATED RINGERS IV SOLN: INTRAVENOUS | Status: DC

## 2019-06-26 MED ORDER — ACETAMINOPHEN 325 MG PO TABS: 650.0000 mg | ORAL_TABLET | ORAL | Status: DC | PRN

## 2019-06-26 MED ORDER — FENTANYL CITRATE (PF) 100 MCG/2ML IJ SOLN
INTRAMUSCULAR | Status: DC | PRN
  Administered 2019-06-26 (×2): 50 ug via INTRAVENOUS

## 2019-06-26 MED ORDER — FENTANYL CITRATE (PF) 100 MCG/2ML IJ SOLN
INTRAMUSCULAR | Status: AC
  Administered 2019-06-26: 25 ug via INTRAVENOUS
  Filled 2019-06-26: qty 2

## 2019-06-26 MED ORDER — ROCURONIUM BROMIDE 100 MG/10ML IV SOLN
INTRAVENOUS | Status: DC | PRN
  Administered 2019-06-26: 35 mg via INTRAVENOUS
  Administered 2019-06-26: 5 mg via INTRAVENOUS

## 2019-06-26 NOTE — Discharge Instructions (Signed)
AMBULATORY SURGERY  DISCHARGE INSTRUCTIONS   1) The drugs that you were given will stay in your system until tomorrow so for the next 24 hours you should not:  A) Drive an automobile B) Make any legal decisions C) Drink any alcoholic beverage   2) You may resume regular meals tomorrow.  Today it is better to start with liquids and gradually work up to solid foods.  You may eat anything you prefer, but it is better to start with liquids, then soup and crackers, and gradually work up to solid foods.   3) Please notify your doctor immediately if you have any unusual bleeding, trouble breathing, redness and pain at the surgery site, drainage, fever, or pain not relieved by medication. 4)   5) Your post-operative visit with Dr.                                     is: Date:                        Time:    Please call to schedule your post-operative visit.  6) Additional Instructions:       Ectopic Pregnancy  An ectopic pregnancy is when the fertilized egg attaches (implants) outside the uterus. Most ectopic pregnancies occur in one of the tubes where eggs travel from the ovary to the uterus (fallopian tubes), but the implanting can occur in other locations. In rare cases, ectopic pregnancies occur on the ovary, intestine, pelvis, abdomen, or cervix. In an ectopic pregnancy, the fertilized egg does not have the ability to develop into a normal, healthy baby. A ruptured ectopic pregnancy is one in which tearing or bursting of a fallopian tube causes internal bleeding. Often, there is intense lower abdominal pain, and vaginal bleeding sometimes occurs. Having an ectopic pregnancy can be life-threatening. If this dangerous condition is not treated, it can lead to blood loss, shock, or even death. What are the causes? The most common cause of this condition is damage to one of the fallopian tubes. A fallopian tube may be narrowed or blocked, and that keeps the fertilized egg from reaching  the uterus. What increases the risk? This condition is more likely to develop in women of childbearing age who have different levels of risk. The levels of risk can be divided into three categories. High risk  You have gone through infertility treatment.  You have had an ectopic pregnancy before.  You have had surgery on the fallopian tubes, or another surgical procedure, such as an abortion.  You have had surgery to have the fallopian tubes tied (tubal ligation).  You have problems or diseases of the fallopian tubes.  You have been exposed to diethylstilbestrol (DES). This medicine was used until 1971, and it had effects on babies whose mothers took the medicine.  You become pregnant while using an IUD (intrauterine device) for birth control. Moderate risk  You have a history of infertility.  You have had an STI (sexually transmitted infection).  You have a history of pelvic inflammatory disease (PID).  You have scarring from endometriosis.  You have multiple sexual partners.  You smoke. Low risk  You have had pelvic surgery.  You use vaginal douches.  You became sexually active before age 35. What are the signs or symptoms? Common symptoms of this condition include normal pregnancy symptoms, such as missing a period, nausea, tiredness,  abdominal pain, breast tenderness, and bleeding. However, ectopic pregnancy will have additional symptoms, such as:  Pain with intercourse.  Irregular vaginal bleeding or spotting.  Cramping or pain on one side or in the lower abdomen.  Fast heartbeat, low blood pressure, and sweating.  Passing out while having a bowel movement. Symptoms of a ruptured ectopic pregnancy and internal bleeding may include:  Sudden, severe pain in the abdomen and pelvis.  Dizziness, weakness, light-headedness, or fainting.  Pain in the shoulder or neck area. How is this diagnosed? This condition is diagnosed by:  A pelvic exam to locate pain or  a mass in the abdomen.  A pregnancy test. This blood test checks for the presence as well as the specific level of pregnancy hormone in the bloodstream.  Ultrasound. This is performed if a pregnancy test is positive. In this test, a probe is inserted into the vagina. The probe will detect a fetus, possibly in a location other than the uterus.  Taking a sample of uterus tissue (dilation and curettage, or D&C).  Surgery to perform a visual exam of the inside of the abdomen using a thin, lighted tube that has a tiny camera on the end (laparoscope).  Culdocentesis. This procedure involves inserting a needle at the top of the vagina, behind the uterus. If blood is present in this area, it may indicate that a fallopian tube is torn. How is this treated? This condition is treated with medicine or surgery. Medicine  An injection of a medicine (methotrexate) may be given to cause the pregnancy tissue to be absorbed. This medicine may save your fallopian tube. It may be given if: ? The diagnosis is made early, with no signs of active bleeding. ? The fallopian tube has not ruptured. ? You are considered to be a good candidate for the medicine. Usually, pregnancy hormone blood levels are checked after methotrexate treatment. This is to be sure that the medicine is effective. It may take 4-6 weeks for the pregnancy to be absorbed. Most pregnancies will be absorbed by 3 weeks. Surgery  A laparoscope may be used to remove the pregnancy tissue.  If severe internal bleeding occurs, a larger cut (incision) may be made in the lower abdomen (laparotomy) to remove the fetus and placenta. This is done to stop the bleeding.  Part or all of the fallopian tube may be removed (salpingectomy) along with the fetus and placenta. The fallopian tube may also be repaired during the surgery.  In very rare circumstances, removal of the uterus (hysterectomy) may be required.  After surgery, pregnancy hormone testing may  be done to be sure that there is no pregnancy tissue left. Whether your treatment is medicine or surgery, you may receive a Rho (D) immune globulin shot to prevent problems with any future pregnancy. This shot may be given if:  You are Rh-negative and the baby's father is Rh-positive.  You are Rh-negative and you do not know the Rh type of the baby's father. Follow these instructions at home:  Rest and limit your activity after the procedure for as long as told by your health care provider.  Until your health care provider says that it is safe: ? Do not lift anything that is heavier than 10 lb (4.5 kg), or the limit that your health care provider tells you. ? Avoid physical exercise and any movement that requires effort (is strenuous).  To help prevent constipation: ? Eat a healthy diet that includes fruits, vegetables, and whole grains. ?  Drink 6-8 glasses of water per day. Get help right away if:  You develop worsening pain that is not relieved by medicine.  You have: ? A fever or chills. ? Vaginal bleeding. ? Redness and swelling at the incision site. ? Nausea and vomiting.  You feel dizzy or weak.  You feel light-headed or you faint. This information is not intended to replace advice given to you by your health care provider. Make sure you discuss any questions you have with your health care provider. Document Released: 08/08/2004 Document Revised: 06/13/2017 Document Reviewed: 01/31/2016 Elsevier Patient Education  2020 [employer].

## 2019-06-26 NOTE — Anesthesia Post-op Follow-up Note (Signed)
Anesthesia QCDR form completed.        

## 2019-06-26 NOTE — Transfer of Care (Signed)
Immediate Anesthesia Transfer of Care Note  Patient: Lillymae Duet  Procedure(s) Performed: DIAGNOSTIC LAPAROSCOPY WITH REMOVAL OF RIGHT SIDED ECTOPIC PREGNANCY (Right ) LAPAROSCOPIC LYSIS OF ADHESIONS LAPAROSCOPIC UNILATERAL SALPINGOSTOMY (Right )  Patient Location: PACU  Anesthesia Type:General  Level of Consciousness: awake and alert   Airway & Oxygen Therapy: Patient Spontanous Breathing  Post-op Assessment: Report given to RN and Post -op Vital signs reviewed and stable  Post vital signs: Reviewed and stable  Last Vitals:  Vitals Value Taken Time  BP 124/70 06/26/19 0140  Temp 36.2 C 06/26/19 0140  Pulse 114 06/26/19 0141  Resp 15 06/26/19 0141  SpO2 100 % 06/26/19 0141  Vitals shown include unvalidated device data.  Last Pain:  Vitals:   06/25/19 2119  TempSrc: Oral  PainSc:          Complications: No apparent anesthesia complications

## 2019-06-26 NOTE — Anesthesia Procedure Notes (Signed)
Procedure Name: Intubation Performed by: Alvin Critchley, MD Pre-anesthesia Checklist: Patient identified, Emergency Drugs available, Suction available, Patient being monitored and Timeout performed Patient Re-evaluated:Patient Re-evaluated prior to induction Oxygen Delivery Method: Circle system utilized Preoxygenation: Pre-oxygenation with 100% oxygen Induction Type: IV induction, Rapid sequence and Cricoid Pressure applied Ventilation: Mask ventilation without difficulty Laryngoscope Size: Mac and 3 Grade View: Grade I Tube type: Oral Tube size: 7.0 mm Number of attempts: 1 Placement Confirmation: ETT inserted through vocal cords under direct vision and positive ETCO2 Secured at: 20 cm Tube secured with: Tape Dental Injury: Teeth and Oropharynx as per pre-operative assessment

## 2019-06-26 NOTE — Op Note (Signed)
Sheri Woods PROCEDURE DATE: 06/26/2019  PREOPERATIVE DIAGNOSIS: Non-Ruptured right ectopic pregnancy, Pelvic pain POSTOPERATIVE DIAGNOSIS: Non-Ruptured right fallopian tube ectopic pregnancy, pelvic pain PROCEDURE: Laparoscopic right salpingostomy and removal of ectopic pregnancy SURGEON:  Dr. Barnett Applebaum ANESTHESIOLOGIST: Alvin Critchley, MD  ANES: General  INDICATIONS: 33 y.o. G1P0 at Unknown here with the preoperative diagnoses as listed above.  Please refer to preoperative notes for more details. Patient was counseled regarding need for laparoscopic salpingectomy. Risks of surgery including bleeding which may require transfusion or reoperation, infection, injury to bowel or other surrounding organs, need for additional procedures including laparotomy and other postoperative/anesthesia complications were explained to patient.  Written informed consent was obtained.  FINDINGS:  Moderate amount of hemoperitoneum estimated to be about 150 mL of blood and clots.  Dilated right fallopian tube containing ectopic gestation. Small normal appearing uterus, normal left fallopian tube other than evidence for prior tubal ligation, normal right ovary and left ovary.  ANESTHESIA: General ESTIMATED BLOOD LOSS: 150 ml SPECIMENS: POC/ ectopic gestation COMPLICATIONS: None immediate  PROCEDURE IN DETAIL:  The patient was taken to the operating room where general anesthesia was administered and was found to be adequate.  She was placed in the dorsal lithotomy position, and was prepped and draped in a sterile manner.  A Foley catheter was inserted into her bladder and attached to constant drainage and a sponge stick was placed vaginally for any manipulation purposes .    After an adequate timeout was performed, attention was turned to the abdomen where an umbilical incision was made with the scalpel.  Veress needle is placed with confirmation using the hanging drop technique.   The abdomen was then insufflated  with carbon dioxide gas and adequate pneumoperitoneum was obtained.  The 5-mm trocar and sleeve were then advanced without difficulty with the laparoscope under direct visualization into the abdomen.   A survey of the patient's pelvis and abdomen revealed the findings above, with adhesions midline as well as over the uterus and right adnexa.  A 5-mm right lower quadrant port was then placed under direct visualization. Additional 11-mm trocar placed in suprapubic region. The suction irrigator was then used to suction the hemoperitoneum and irrigate the pelvis.  Lysis of adhesions was performed to aid visualization of the uterus and adnexa; no bowel was near the adhesions.  Attention was then turned to the right fallopian tube which was grasped and using the Harmonic instrument a linear salpingostomy incision was made with extraction of clot and/or tissue.  Good hemostasis was noted.  The specimen was removed from the abdomen.  Irrigation performed.  The larger suprapubic port site fascia is closed with a 2-0 vicryl suture.  The abdomen was desufflated, and all instruments were removed.  All skin incisions were closed Dermabond. The patient tolerated the procedure well.  All instruments, needles, and sponge counts were correct x 2. The patient was taken to the recovery room in stable condition.   The patient will be discharged to home as per PACU criteria.  Routine postoperative instructions given.   Barnett Applebaum, MD, [facility], [facility] 06/26/2019  1:39 AM

## 2019-06-26 NOTE — Anesthesia Postprocedure Evaluation (Signed)
Anesthesia Post Note  Patient: Sheri Woods  Procedure(s) Performed: DIAGNOSTIC LAPAROSCOPY WITH REMOVAL OF RIGHT SIDED ECTOPIC PREGNANCY (Right ) LAPAROSCOPIC LYSIS OF ADHESIONS LAPAROSCOPIC UNILATERAL SALPINGOSTOMY (Right )  Anesthesia Type: General Level of consciousness: awake and alert and oriented Pain management: pain level controlled Vital Signs Assessment: post-procedure vital signs reviewed and stable Respiratory status: spontaneous breathing Cardiovascular status: blood pressure returned to baseline Anesthetic complications: no     Last Vitals:  Vitals:   06/25/19 2119 06/26/19 0140  BP: (!) 153/99 124/70  Pulse: 79 (!) 111  Resp: 16 20  Temp: 36.8 C (!) 36.2 C  SpO2: 100% 100%    Last Pain:  Vitals:   06/26/19 0140  TempSrc:   PainSc: 0-No pain                 Paton Crum

## 2019-06-28 ENCOUNTER — Telehealth: Payer: Self-pay | Admitting: Obstetrics & Gynecology

## 2019-06-28 LAB — TYPE AND SCREEN
ABO/RH(D): O NEG
Antibody Screen: NEGATIVE

## 2019-06-28 NOTE — Telephone Encounter (Signed)
-----   Message from Gae Dry, MD sent at 06/26/2019  1:43 AM EST ----- Regarding: post op from ER, new patient Sch Post Op w me dec 21 or so

## 2019-06-28 NOTE — Telephone Encounter (Signed)
Spoke with family left generic message to have patient give [place] a back

## 2019-06-29 LAB — SURGICAL PATHOLOGY

## 2019-06-29 NOTE — Telephone Encounter (Signed)
Patient is schedule 07/05/19

## 2019-06-30 ENCOUNTER — Telehealth: Payer: Self-pay

## 2019-06-30 ENCOUNTER — Other Ambulatory Visit: Payer: Self-pay | Admitting: Obstetrics & Gynecology

## 2019-06-30 MED ORDER — TRAMADOL HCL 50 MG PO TABS: 50.0000 mg | ORAL_TABLET | Freq: Four times a day (QID) | ORAL | 0 refills | Status: DC | PRN

## 2019-06-30 NOTE — Telephone Encounter (Signed)
Pt aware.

## 2019-06-30 NOTE — Telephone Encounter (Signed)
Pt calling; is she supposed to change the gauze/bandage or leave it like it is until appt?  [phone] or [phone]  Pt states she is still in a lot of pain - 8/10 on pain scale; thought she would feel better than this.  Adv to leave gauze/bandage 'til appt unless soiled.  Please call pt re pain medicine.  The pain med she has now only helps pain for about 2hrs.

## 2019-06-30 NOTE — Telephone Encounter (Signed)
Can you send in more pain med for her? Or should se be seen if in this much pain?

## 2019-06-30 NOTE — Telephone Encounter (Signed)
Remove bandages. Shower.  Ibuprofen.  Refill of differnet pain med done.

## 2019-07-01 NOTE — Telephone Encounter (Signed)
Patient is calling to follow up on pain medication sent in. Please advise

## 2019-07-05 ENCOUNTER — Other Ambulatory Visit: Payer: Self-pay

## 2019-07-05 ENCOUNTER — Ambulatory Visit (INDEPENDENT_AMBULATORY_CARE_PROVIDER_SITE_OTHER): Payer: Medicare Other | Admitting: Obstetrics & Gynecology

## 2019-07-05 ENCOUNTER — Encounter: Payer: Self-pay | Admitting: Obstetrics & Gynecology

## 2019-07-05 VITALS — BP 120/80 | Ht 63.0 in | Wt 121.0 lb

## 2019-07-05 DIAGNOSIS — O00101 Right tubal pregnancy without intrauterine pregnancy: Secondary | ICD-10-CM

## 2019-07-05 NOTE — Patient Instructions (Signed)
Health Maintenance, Female Adopting a healthy lifestyle and getting preventive care are important in promoting health and wellness. Ask your health care provider about:  The right schedule for you to have regular tests and exams.  Things you can do on your own to prevent diseases and keep yourself healthy. What should I know about diet, weight, and exercise? Eat a healthy diet   Eat a diet that includes plenty of vegetables, fruits, low-fat dairy products, and lean protein.  Do not eat a lot of foods that are high in solid fats, added sugars, or sodium. Maintain a healthy weight Body mass index (BMI) is used to identify weight problems. It estimates body fat based on height and weight. Your health care provider can help determine your BMI and help you achieve or maintain a healthy weight. Get regular exercise Get regular exercise. This is one of the most important things you can do for your health. Most adults should:  Exercise for at least 150 minutes each week. The exercise should increase your heart rate and make you sweat (moderate-intensity exercise).  Do strengthening exercises at least twice a week. This is in addition to the moderate-intensity exercise.  Spend less time sitting. Even light physical activity can be beneficial. Watch cholesterol and blood lipids Have your blood tested for lipids and cholesterol at 35 years of age, then have this test every 5 years. Have your cholesterol levels checked more often if:  Your lipid or cholesterol levels are high.  You are older than 35 years of age.  You are at high risk for heart disease. What should I know about cancer screening? Depending on your health history and family history, you may need to have cancer screening at various ages. This may include screening for:  Breast cancer.  Cervical cancer.  Colorectal cancer.  Skin cancer.  Lung cancer. What should I know about heart disease, diabetes, and high blood  pressure? Blood pressure and heart disease  High blood pressure causes heart disease and increases the risk of stroke. This is more likely to develop in people who have high blood pressure readings, are of African descent, or are overweight.  Have your blood pressure checked: ? Every 3-5 years if you are 18-39 years of age. ? Every year if you are 40 years old or older. Diabetes Have regular diabetes screenings. This checks your fasting blood sugar level. Have the screening done:  Once every three years after age 40 if you are at a normal weight and have a low risk for diabetes.  More often and at a younger age if you are overweight or have a high risk for diabetes. What should I know about preventing infection? Hepatitis B If you have a higher risk for hepatitis B, you should be screened for this virus. Talk with your health care provider to find out if you are at risk for hepatitis B infection. Hepatitis C Testing is recommended for:  Everyone born from 1945 through 1965.  Anyone with known risk factors for hepatitis C. Sexually transmitted infections (STIs)  Get screened for STIs, including gonorrhea and chlamydia, if: ? You are sexually active and are younger than 35 years of age. ? You are older than 35 years of age and your health care provider tells you that you are at risk for this type of infection. ? Your sexual activity has changed since you were last screened, and you are at increased risk for chlamydia or gonorrhea. Ask your health care provider if   you are at risk.  Ask your health care provider about whether you are at high risk for HIV. Your health care provider may recommend a prescription medicine to help prevent HIV infection. If you choose to take medicine to prevent HIV, you should first get tested for HIV. You should then be tested every 3 months for as long as you are taking the medicine. Pregnancy  If you are about to stop having your period (premenopausal) and  you may become pregnant, seek counseling before you get pregnant.  Take 400 to 800 micrograms (mcg) of folic acid every day if you become pregnant.  Ask for birth control (contraception) if you want to prevent pregnancy. Osteoporosis and menopause Osteoporosis is a disease in which the bones lose minerals and strength with aging. This can result in bone fractures. If you are 65 years old or older, or if you are at risk for osteoporosis and fractures, ask your health care provider if you should:  Be screened for bone loss.  Take a calcium or vitamin D supplement to lower your risk of fractures.  Be given hormone replacement therapy (HRT) to treat symptoms of menopause. Follow these instructions at home: Lifestyle  Do not use any products that contain nicotine or tobacco, such as cigarettes, e-cigarettes, and chewing tobacco. If you need help quitting, ask your health care provider.  Do not use street drugs.  Do not share needles.  Ask your health care provider for help if you need support or information about quitting drugs. Alcohol use  Do not drink alcohol if: ? Your health care provider tells you not to drink. ? You are pregnant, may be pregnant, or are planning to become pregnant.  If you drink alcohol: ? Limit how much you use to 0-1 drink a day. ? Limit intake if you are breastfeeding.  Be aware of how much alcohol is in your drink. In the [place] one drink equals one 12 oz bottle of beer (355 mL), one 5 oz glass of wine (148 mL), or one 1 oz glass of hard liquor (44 mL). General instructions  Schedule regular health, dental, and eye exams.  Stay current with your vaccines.  Tell your health care provider if: ? You often feel depressed. ? You have ever been abused or do not feel safe at home. Summary  Adopting a healthy lifestyle and getting preventive care are important in promoting health and wellness.  Follow your health care provider's instructions about healthy  diet, exercising, and getting tested or screened for diseases.  Follow your health care provider's instructions on monitoring your cholesterol and blood pressure. This information is not intended to replace advice given to you by your health care provider. Make sure you discuss any questions you have with your health care provider. Document Released: 01/14/2011 Document Revised: 06/24/2018 Document Reviewed: 06/24/2018 Elsevier Patient Education  2020 [employer]  

## 2019-07-05 NOTE — Progress Notes (Signed)
  Postoperative Follow-up Patient presents post op from right salpingostomy for right ectopic pregnancy, 1 week ago.  Subjective: Patient reports marked improvement in her preop symptoms. Eating a regular diet without difficulty. The patient is not having any pain.  Activity: normal activities of daily living. Patient reports additional symptom's since surgery of No vag bleeding  Objective: BP 120/80   Ht 5\' 3"  (1.6 m)   Wt 121 lb (54.9 kg)   LMP 04/28/2019   BMI 21.43 kg/m  Physical Exam Constitutional:      General: She is not in acute distress.    Appearance: She is well-developed.  Cardiovascular:     Rate and Rhythm: Normal rate.  Pulmonary:     Effort: Pulmonary effort is normal.  Abdominal:     General: There is no distension.     Palpations: Abdomen is soft.     Tenderness: There is no abdominal tenderness.     Comments: Incision Healing Well   Musculoskeletal:        General: Normal range of motion.  Neurological:     Mental Status: She is alert and oriented to person, place, and time.     Cranial Nerves: No cranial nerve deficit.  Skin:    General: Skin is warm and dry.    Assessment: s/p :  laparoscopy and right salpingostomy progressing well  Plan: Patient has done well after surgery with no apparent complications.  I have discussed the post-operative course to date, and the expected progress moving forward.  The patient understands what complications to be concerned about.  I will see the patient in routine follow up, or sooner if needed.    Activity plan: No restriction. Beta hCG today in follow up Needs PAP. Counseled.  Will schedule appt for Annual w PAP (not willing to do today)  Hoyt Koch 07/05/2019, 3:12 PM

## 2019-07-06 LAB — BETA HCG QUANT (REF LAB): hCG Quant: 7 m[IU]/mL

## 2019-07-06 NOTE — Progress Notes (Signed)
Let her know pregnancy hormone level back to almost zero, an appropriate response after surgery.   No further testing required.

## 2020-08-14 ENCOUNTER — Other Ambulatory Visit: Payer: Self-pay

## 2020-08-14 ENCOUNTER — Ambulatory Visit (HOSPITAL_COMMUNITY)
Admission: RE | Admit: 2020-08-14 | Discharge: 2020-08-14 | Disposition: A | Discharge status: Home / Self Care | Payer: Medicare Other | Source: Ambulatory Visit | Attending: Family Medicine | Admitting: Family Medicine

## 2020-08-14 ENCOUNTER — Other Ambulatory Visit (HOSPITAL_COMMUNITY): Payer: Self-pay | Admitting: Family Medicine

## 2020-08-14 ENCOUNTER — Encounter (HOSPITAL_COMMUNITY): Payer: Self-pay

## 2020-08-14 DIAGNOSIS — I110 Hypertensive heart disease with heart failure: Secondary | ICD-10-CM | POA: Diagnosis not present

## 2020-08-14 DIAGNOSIS — M542 Cervicalgia: Secondary | ICD-10-CM | POA: Diagnosis not present

## 2020-08-14 DIAGNOSIS — D510 Vitamin B12 deficiency anemia due to intrinsic factor deficiency: Secondary | ICD-10-CM | POA: Diagnosis not present

## 2020-08-14 DIAGNOSIS — M1990 Unspecified osteoarthritis, unspecified site: Secondary | ICD-10-CM | POA: Insufficient documentation

## 2020-08-14 DIAGNOSIS — I10 Essential (primary) hypertension: Secondary | ICD-10-CM | POA: Diagnosis not present

## 2020-08-14 DIAGNOSIS — E559 Vitamin D deficiency, unspecified: Secondary | ICD-10-CM | POA: Diagnosis not present

## 2020-08-14 DIAGNOSIS — R5383 Other fatigue: Secondary | ICD-10-CM | POA: Diagnosis not present

## 2020-08-14 DIAGNOSIS — E7849 Other hyperlipidemia: Secondary | ICD-10-CM | POA: Diagnosis not present

## 2020-08-28 DIAGNOSIS — E7849 Other hyperlipidemia: Secondary | ICD-10-CM | POA: Diagnosis not present

## 2020-08-28 DIAGNOSIS — I110 Hypertensive heart disease with heart failure: Secondary | ICD-10-CM | POA: Diagnosis not present

## 2020-08-28 DIAGNOSIS — R5382 Chronic fatigue, unspecified: Secondary | ICD-10-CM | POA: Diagnosis not present

## 2020-09-28 DIAGNOSIS — I110 Hypertensive heart disease with heart failure: Secondary | ICD-10-CM | POA: Diagnosis not present

## 2020-11-11 DIAGNOSIS — I10 Essential (primary) hypertension: Secondary | ICD-10-CM | POA: Diagnosis not present

## 2020-11-22 ENCOUNTER — Other Ambulatory Visit: Payer: Self-pay

## 2020-11-22 ENCOUNTER — Other Ambulatory Visit (HOSPITAL_COMMUNITY): Payer: Self-pay | Admitting: Family Medicine

## 2020-11-22 ENCOUNTER — Ambulatory Visit (HOSPITAL_COMMUNITY)
Admission: RE | Admit: 2020-11-22 | Discharge: 2020-11-22 | Disposition: A | Discharge status: Home / Self Care | Payer: Medicare Other | Source: Ambulatory Visit | Attending: Family Medicine | Admitting: Family Medicine

## 2020-11-22 DIAGNOSIS — M25511 Pain in right shoulder: Secondary | ICD-10-CM | POA: Insufficient documentation

## 2020-11-22 DIAGNOSIS — I110 Hypertensive heart disease with heart failure: Secondary | ICD-10-CM | POA: Diagnosis not present

## 2020-11-22 DIAGNOSIS — M25512 Pain in left shoulder: Secondary | ICD-10-CM | POA: Diagnosis not present

## 2020-11-22 DIAGNOSIS — K219 Gastro-esophageal reflux disease without esophagitis: Secondary | ICD-10-CM | POA: Diagnosis not present

## 2020-11-22 DIAGNOSIS — M25561 Pain in right knee: Secondary | ICD-10-CM | POA: Insufficient documentation

## 2020-11-22 DIAGNOSIS — E7849 Other hyperlipidemia: Secondary | ICD-10-CM | POA: Diagnosis not present

## 2020-11-29 DIAGNOSIS — E7849 Other hyperlipidemia: Secondary | ICD-10-CM | POA: Diagnosis not present

## 2020-11-29 DIAGNOSIS — I119 Hypertensive heart disease without heart failure: Secondary | ICD-10-CM | POA: Diagnosis not present

## 2020-11-29 DIAGNOSIS — E559 Vitamin D deficiency, unspecified: Secondary | ICD-10-CM | POA: Diagnosis not present

## 2021-04-02 DIAGNOSIS — Z01818 Encounter for other preprocedural examination: Secondary | ICD-10-CM | POA: Diagnosis not present

## 2021-11-06 DIAGNOSIS — Z1389 Encounter for screening for other disorder: Secondary | ICD-10-CM | POA: Diagnosis not present

## 2021-11-06 DIAGNOSIS — Z800 Family history of malignant neoplasm of digestive organs: Secondary | ICD-10-CM | POA: Diagnosis not present

## 2021-11-06 DIAGNOSIS — M25511 Pain in right shoulder: Secondary | ICD-10-CM | POA: Diagnosis not present

## 2021-11-06 DIAGNOSIS — G8929 Other chronic pain: Secondary | ICD-10-CM | POA: Diagnosis not present

## 2021-11-06 DIAGNOSIS — M25571 Pain in right ankle and joints of right foot: Secondary | ICD-10-CM | POA: Diagnosis not present

## 2022-04-01 DIAGNOSIS — L089 Local infection of the skin and subcutaneous tissue, unspecified: Secondary | ICD-10-CM | POA: Diagnosis not present

## 2022-04-01 DIAGNOSIS — Z23 Encounter for immunization: Secondary | ICD-10-CM | POA: Diagnosis not present

## 2022-04-01 DIAGNOSIS — S91331A Puncture wound without foreign body, right foot, initial encounter: Secondary | ICD-10-CM | POA: Diagnosis not present

## 2022-07-11 DIAGNOSIS — J209 Acute bronchitis, unspecified: Secondary | ICD-10-CM | POA: Diagnosis not present

## 2022-07-29 ENCOUNTER — Other Ambulatory Visit: Payer: Self-pay

## 2022-07-29 ENCOUNTER — Emergency Department
Admission: EM | Admit: 2022-07-29 | Discharge: 2022-07-29 | Disposition: A | Discharge status: Home / Self Care | Payer: 59 | Attending: Emergency Medicine | Admitting: Emergency Medicine

## 2022-07-29 DIAGNOSIS — R1030 Lower abdominal pain, unspecified: Secondary | ICD-10-CM | POA: Diagnosis present

## 2022-07-29 DIAGNOSIS — N739 Female pelvic inflammatory disease, unspecified: Secondary | ICD-10-CM | POA: Insufficient documentation

## 2022-07-29 DIAGNOSIS — T192XXA Foreign body in vulva and vagina, initial encounter: Secondary | ICD-10-CM

## 2022-07-29 DIAGNOSIS — Z8673 Personal history of transient ischemic attack (TIA), and cerebral infarction without residual deficits: Secondary | ICD-10-CM | POA: Insufficient documentation

## 2022-07-29 DIAGNOSIS — I10 Essential (primary) hypertension: Secondary | ICD-10-CM | POA: Diagnosis not present

## 2022-07-29 DIAGNOSIS — N730 Acute parametritis and pelvic cellulitis: Secondary | ICD-10-CM

## 2022-07-29 DIAGNOSIS — Z7982 Long term (current) use of aspirin: Secondary | ICD-10-CM | POA: Insufficient documentation

## 2022-07-29 LAB — COMPREHENSIVE METABOLIC PANEL
ALT: 20 U/L (ref 0–44)
AST: 20 U/L (ref 15–41)
Albumin: 4.2 g/dL (ref 3.5–5.0)
Alkaline Phosphatase: 57 U/L (ref 38–126)
Anion gap: 8 (ref 5–15)
BUN: 26 mg/dL — ABNORMAL HIGH (ref 6–20)
CO2: 23 mmol/L (ref 22–32)
Calcium: 9 mg/dL (ref 8.9–10.3)
Chloride: 102 mmol/L (ref 98–111)
Creatinine, Ser: 0.77 mg/dL (ref 0.44–1.00)
GFR, Estimated: >60 mL/min (ref >60)
Glucose, Bld: 108 mg/dL — ABNORMAL HIGH (ref 70–99)
Potassium: 4 mmol/L (ref 3.5–5.1)
Sodium: 133 mmol/L — ABNORMAL LOW (ref 135–145)
Total Bilirubin: 0.5 mg/dL (ref 0.3–1.2)
Total Protein: 7.9 g/dL (ref 6.5–8.1)

## 2022-07-29 LAB — CBC
HCT: 37.4 % (ref 36.0–46.0)
Hemoglobin: 12.7 g/dL (ref 12.0–15.0)
MCH: 29.5 pg (ref 26.0–34.0)
MCHC: 34 g/dL (ref 30.0–36.0)
MCV: 87 fL (ref 80.0–100.0)
Platelets: 446 10*3/uL — ABNORMAL HIGH (ref 150–400)
RBC: 4.3 MIL/uL (ref 3.87–5.11)
RDW: 13.6 % (ref 11.5–15.5)
WBC: 12.1 10*3/uL — ABNORMAL HIGH (ref 4.0–10.5)
nRBC: 0 % (ref 0.0–0.2)

## 2022-07-29 LAB — URINALYSIS, ROUTINE W REFLEX MICROSCOPIC
Bilirubin Urine: NEGATIVE
Glucose, UA: 50 mg/dL — AB
Hgb urine dipstick: NEGATIVE
Ketones, ur: NEGATIVE mg/dL
Leukocytes,Ua: NEGATIVE
Nitrite: NEGATIVE
Protein, ur: NEGATIVE mg/dL
Specific Gravity, Urine: 1.027 (ref 1.005–1.030)
pH: 5 (ref 5.0–8.0)

## 2022-07-29 LAB — CHLAMYDIA/NGC RT PCR (ARMC ONLY)
Chlamydia Tr: NOT DETECTED
N gonorrhoeae: NOT DETECTED

## 2022-07-29 LAB — WET PREP, GENITAL
Clue Cells Wet Prep HPF POC: NONE SEEN
Sperm: NONE SEEN
Trich, Wet Prep: NONE SEEN
WBC, Wet Prep HPF POC: <10 — AB (ref <10)
Yeast Wet Prep HPF POC: NONE SEEN

## 2022-07-29 LAB — HCG, QUANTITATIVE, PREGNANCY: hCG, Beta Chain, Quant, S: <1 m[IU]/mL (ref <5)

## 2022-07-29 MED ORDER — ONDANSETRON 4 MG PO TBDP: 4.0000 mg | ORAL_TABLET | Freq: Four times a day (QID) | ORAL | 0 refills | Status: DC | PRN

## 2022-07-29 MED ORDER — CEFTRIAXONE SODIUM 500 MG IJ SOLR
500.0000 mg | Freq: Once | INTRAMUSCULAR | Status: AC
  Administered 2022-07-29: 500 mg via INTRAMUSCULAR
  Filled 2022-07-29: qty 500

## 2022-07-29 MED ORDER — ONDANSETRON 4 MG PO TBDP
4.0000 mg | ORAL_TABLET | Freq: Once | ORAL | Status: AC
  Administered 2022-07-29: 4 mg via ORAL
  Filled 2022-07-29: qty 1

## 2022-07-29 MED ORDER — METRONIDAZOLE 500 MG PO TABS
500.0000 mg | ORAL_TABLET | Freq: Once | ORAL | Status: AC
  Administered 2022-07-29: 500 mg via ORAL
  Filled 2022-07-29: qty 1

## 2022-07-29 MED ORDER — IBUPROFEN 800 MG PO TABS
800.0000 mg | ORAL_TABLET | Freq: Once | ORAL | Status: AC
  Administered 2022-07-29: 800 mg via ORAL
  Filled 2022-07-29: qty 1

## 2022-07-29 MED ORDER — DOXYCYCLINE HYCLATE 100 MG PO TABS
100.0000 mg | ORAL_TABLET | Freq: Once | ORAL | Status: AC
  Administered 2022-07-29: 100 mg via ORAL
  Filled 2022-07-29: qty 1

## 2022-07-29 MED ORDER — METRONIDAZOLE 500 MG PO TABS: 500.0000 mg | ORAL_TABLET | Freq: Two times a day (BID) | ORAL | 0 refills | Status: AC

## 2022-07-29 MED ORDER — DOXYCYCLINE HYCLATE 100 MG PO TABS: 100.0000 mg | ORAL_TABLET | Freq: Two times a day (BID) | ORAL | 0 refills | Status: AC

## 2022-07-29 NOTE — ED Notes (Signed)
Pelvic cart was brought to bedside.  Dr. Leonides Schanz performed pelvic exam, and a tampon was removed which had been in for and undetermined amount of time.  Patient stated that she could not remember when she last used a tampon.

## 2022-07-29 NOTE — ED Notes (Signed)
Pharmacy has been messaged about sending the dose of rocephin

## 2022-07-29 NOTE — ED Triage Notes (Signed)
Complaining of having bleeding when she has intercourse, said it is very foul smelling.  She said that she had a tubaligation 7 years ago but she said that she thinks she felt movement in her abdomen 2 weeks ago.

## 2022-07-29 NOTE — Discharge Instructions (Addendum)
You may alternate Tylenol 1000 mg every 6 hours as needed for pain, fever and Ibuprofen 800 mg every 6-8 hours as needed for pain, fever.  Please take Ibuprofen with food.  Do not take more than 4000 mg of Tylenol (acetaminophen) in a 24 hour period. ° °

## 2022-07-29 NOTE — ED Notes (Signed)
All medication given to patient as ordered.  Still waiting on central pharmacy to provide rocephin

## 2022-07-29 NOTE — ED Provider Notes (Signed)
[facility] Provider Note    Event Date/Time   First MD Initiated Contact with Patient 07/29/22 0422     (approximate)   History   Abdominal Pain   HPI  Sheri Woods is a 37 y.o. female with history of seizures, previous stroke, hypertension who presents to the emergency department with complaints of foul odor coming from her vagina that she noticed while having intercourse with her significant other.  She thinks that there is a retained tampon.  She is unable to tell me when exactly this tampon was placed.  She states she saw some vaginal bleeding today.  After intercourse she started having some lower abdominal pain.  No abnormal discharge.  No dysuria.  She has had previous C-section x 4, laparoscopy for removal of a right-sided ectopic pregnancy with right salpingectomy.  States she has had a tubal ligation.  No fevers.  No vomiting.  States she is feeling lightheaded.  States her and her boyfriend were intoxicated tonight.   History provided by patient.    Past Medical History:  Diagnosis Date   Head trauma    Headache(784.0)    Seizures ([place])    Stroke [facility])     Past Surgical History:  Procedure Laterality Date   CESAREAN SECTION     X 4   DIAGNOSTIC LAPAROSCOPY WITH REMOVAL OF ECTOPIC PREGNANCY Right 06/25/2019   Procedure: DIAGNOSTIC LAPAROSCOPY WITH REMOVAL OF RIGHT SIDED ECTOPIC PREGNANCY;  Surgeon: Gae Dry, MD;  Location: [facility] ORS;  Service: Gynecology;  Laterality: Right;   LAPAROSCOPIC LYSIS OF ADHESIONS  06/25/2019   Procedure: LAPAROSCOPIC LYSIS OF ADHESIONS;  Surgeon: Gae Dry, MD;  Location: [facility] ORS;  Service: Gynecology;;   LAPAROSCOPIC UNILATERAL SALPINGECTOMY Right 06/25/2019   Procedure: LAPAROSCOPIC UNILATERAL SALPINGOSTOMY;  Surgeon: Gae Dry, MD;  Location: [facility] ORS;  Service: Gynecology;  Laterality: Right;   TEE WITHOUT CARDIOVERSION N/A 11/17/2012   Procedure: TRANSESOPHAGEAL ECHOCARDIOGRAM (TEE);   Surgeon: Jolaine Artist, MD;  Location: [facility] ENDOSCOPY;  Service: Cardiovascular;  Laterality: N/A;    MEDICATIONS:  Prior to Admission medications   Medication Sig Start Date End Date Taking? Authorizing Provider  aspirin 81 MG chewable tablet Chew 1 tablet (81 mg total) by mouth daily. Patient not taking: Reported on 07/05/2019 11/11/17   Epifanio Lesches, MD  lisinopril (PRINIVIL,ZESTRIL) 10 MG tablet Take 1 tablet (10 mg total) by mouth daily. 11/11/17   Epifanio Lesches, MD  naproxen (NAPROSYN) 500 MG tablet Take 1 tablet (500 mg total) by mouth 2 (two) times daily with a meal. Patient not taking: Reported on 07/05/2019 10/10/18   Lavonia Drafts, MD  ondansetron (ZOFRAN ODT) 4 MG disintegrating tablet Take 1 tablet (4 mg total) by mouth every 8 (eight) hours as needed for nausea or vomiting. Patient not taking: Reported on 07/05/2019 03/06/18   Rudene Re, MD  sertraline (ZOLOFT) 50 MG tablet Take 1 tablet (50 mg total) by mouth at bedtime. 11/11/17   Epifanio Lesches, MD  traMADol (ULTRAM) 50 MG tablet Take 1 tablet (50 mg total) by mouth every 6 (six) hours as needed. Patient not taking: Reported on 07/05/2019 06/30/19   Gae Dry, MD    Physical Exam   Triage Vital Signs: ED Triage Vitals  Enc Vitals Group     BP 07/29/22 0233 109/86     Pulse Rate 07/29/22 0233 (!) 114     Resp 07/29/22 0233 18     Temp 07/29/22 0233 98.4 F (36.9  C)     Temp Source 07/29/22 0233 Oral     SpO2 07/29/22 0233 100 %     Weight 07/29/22 0234 100 lb (45.4 kg)     Height 07/29/22 0234 5\' 2"  (1.575 m)     Head Circumference --      Peak Flow --      Pain Score 07/29/22 0234 5     Pain Loc --      Pain Edu? --      Excl. in GC? --     Most recent vital signs: Vitals:   07/29/22 0233 07/29/22 0516  BP: 109/86 113/83  Pulse: (!) 114 89  Resp: 18 18  Temp: 98.4 F (36.9 C) 98.3 F (36.8 C)  SpO2: 100% 100%    CONSTITUTIONAL: Alert and oriented and responds  appropriately to questions.  Chronically ill-appearing, histrionic behavior, has a difficult time giving clear history HEAD: Normocephalic, atraumatic EYES: Conjunctivae clear, pupils appear equal, sclera nonicteric ENT: normal nose; moist mucous membranes NECK: Supple, normal ROM CARD: Regular and tachycardic; S1 and S2 appreciated; no murmurs, no clicks, no rubs, no gallops RESP: Normal chest excursion without splinting or tachypnea; breath sounds clear and equal bilaterally; no wheezes, no rhonchi, no rales, no hypoxia or respiratory distress, speaking full sentences ABD/GI: Normal bowel sounds; non-distended; soft, non-tender, no rebound, no guarding, no peritoneal signs GU:  Normal external genitalia. No lesions, rashes noted. Patient has no vaginal bleeding on exam.  She does have a retained tampon that was up against the cervix that was removed with ring forceps without difficulty.  No residual foreign body.  She does have some foul odor secondary to this retained tampon.  No vaginal discharge.  No adnexal tenderness, mass or fullness.  Patient does have cervical motion tenderness.  Cervix is not appear friable.  Cervix is closed.  Chaperone present for exam. EXT: Normal ROM in all joints; no deformity noted, no edema; no cyanosis SKIN: Normal color for age and race; warm; no rash on exposed skin NEURO: Moves all extremities equally, normal speech, ambulates with normal gait    ED Results / Procedures / Treatments   LABS: (all labs ordered are listed, but only abnormal results are displayed) Labs Reviewed  WET PREP, GENITAL - Abnormal; Notable for the following components:      Result Value   WBC, Wet Prep HPF POC <10 (*)    All other components within normal limits  CBC - Abnormal; Notable for the following components:   WBC 12.1 (*)    Platelets 446 (*)    All other components within normal limits  COMPREHENSIVE METABOLIC PANEL - Abnormal; Notable for the following components:    Sodium 133 (*)    Glucose, Bld 108 (*)    BUN 26 (*)    All other components within normal limits  URINALYSIS, ROUTINE W REFLEX MICROSCOPIC - Abnormal; Notable for the following components:   Color, Urine YELLOW (*)    APPearance CLEAR (*)    Glucose, UA 50 (*)    All other components within normal limits  CHLAMYDIA/NGC RT PCR ([facility] ONLY)            HCG, QUANTITATIVE, PREGNANCY     EKG:   RADIOLOGY: My personal review and interpretation of imaging:    I have personally reviewed all radiology reports.   No results found.   PROCEDURES:  Critical Care performed: No    .Foreign Body Removal  Date/Time: 07/29/2022 5:00 AM  Performed by: Debbe Crumble, Layla Maw, DO Authorized by: Ambyr Qadri, Layla Maw, DO  Consent: Verbal consent obtained. Risks and benefits: risks, benefits and alternatives were discussed Consent given by: patient Patient understanding: patient states understanding of the procedure being performed Patient consent: the patient's understanding of the procedure matches consent given Procedure consent: procedure consent matches procedure scheduled Relevant documents: relevant documents present and verified Test results: test results available and properly labeled Required items: required blood products, implants, devices, and special equipment available Patient identity confirmed: verbally with patient Time out: Immediately prior to procedure a "time out" was called to verify the correct patient, procedure, equipment, support staff and site/side marked as required. Body area: vagina  Sedation: Patient sedated: no  Patient restrained: no Patient cooperative: yes Localization method: visualized Removal mechanism: forceps Complexity: simple 1 objects recovered. Objects recovered: tampon Post-procedure assessment: foreign body removed Patient tolerance: patient tolerated the procedure well with no immediate complications      IMPRESSION / MDM / ASSESSMENT AND  PLAN / ED COURSE  I reviewed the triage vital signs and the nursing notes.    Patient here with concerns for retained tampon.  Complains of vaginal odor, abdominal pain.     DIFFERENTIAL DIAGNOSIS (includes but not limited to):   Retained foreign body.  Low suspicion for toxic shock syndrome.  Differential also includes PID, UTI, pregnancy, ectopic, TOA, less likely torsion, cyst given benign abdominal exam.   Patient's presentation is most consistent with acute complicated illness / injury requiring diagnostic workup.   PLAN: Pelvic exam does reveal of retained tampon that was easily removed with ring forceps.  She does have cervical motion tenderness on exam.  Will treat for PID here.  Pelvic swabs pending.  No abdominal pain on exam or masses appreciated so low suspicion for torsion, TOA.  Will give ibuprofen, Zofran and encourage fluids.  Urinalysis, pregnancy test pending.  Labs obtained from triage show leukocytosis which appears chronic for patient.  Normal hemoglobin.  Normal electrolytes, renal function, LFTs.   MEDICATIONS GIVEN IN ED: Medications  cefTRIAXone (ROCEPHIN) injection 500 mg (has no administration in time range)  doxycycline (VIBRA-TABS) tablet 100 mg (100 mg Oral Given 07/29/22 0434)  ibuprofen (ADVIL) tablet 800 mg (800 mg Oral Given 07/29/22 0434)  ondansetron (ZOFRAN-ODT) disintegrating tablet 4 mg (4 mg Oral Given 07/29/22 0433)  metroNIDAZOLE (FLAGYL) tablet 500 mg (500 mg Oral Given 07/29/22 0434)     ED COURSE: hCG negative.  Wet prep and gonorrhea, chlamydia swabs negative.  Urine shows no sign infection.  Heart rate has improved.  Patient resting comfortably, tolerating p.o.  Patient states she hopes to have more children.  She has had 5 previous pregnancies.  1 ectopic, 4 C-sections.  Discussed with patient that her record states she has had a previous right salpingectomy due to her ectopic.  She states she was not aware of this.  She is wondering what  kind of tubal ligation she had at [facility].  Discussed with patient that she will have to talk to OB/GYN office at [facility] for this information.  Discussed with her if she is not able to have children naturally due to bilateral salpingectomy that IVF is always an option.  Recommended again close follow-up with her OB/GYN as an outpatient to have this discussion further on a nonemergent basis.   At this time, I do not feel there is any life-threatening condition present. I reviewed all nursing notes, vitals, pertinent previous records.  All lab and urine results,  EKGs, imaging ordered have been independently reviewed and interpreted by myself.  I reviewed all available radiology reports from any imaging ordered this visit.  Based on my assessment, I feel the patient is safe to be discharged home without further emergent workup and can continue workup as an outpatient as needed. Discussed all findings, treatment plan as well as usual and customary return precautions.  They verbalize understanding and are comfortable with this plan.  Outpatient follow-up has been provided as needed.  All questions have been answered.   CONSULTS:  none   OUTSIDE RECORDS REVIEWED: Reviewed patient's last neurology note in January 2018.       FINAL CLINICAL IMPRESSION(S) / ED DIAGNOSES   Final diagnoses:  Retained tampon, initial encounter  PID (acute pelvic inflammatory disease)     Rx / DC Orders   ED Discharge Orders          Ordered    doxycycline (VIBRA-TABS) 100 MG tablet  2 times daily        07/29/22 0513    metroNIDAZOLE (FLAGYL) 500 MG tablet  2 times daily        07/29/22 0513    ondansetron (ZOFRAN-ODT) 4 MG disintegrating tablet  Every 6 hours PRN        07/29/22 0600             Note:  This document was prepared using Dragon voice recognition software and may include unintentional dictation errors.   Caleen Taaffe, Delice Bison, DO 07/29/22 0600

## 2022-08-20 ENCOUNTER — Ambulatory Visit (LOCAL_COMMUNITY_HEALTH_CENTER): Payer: 59

## 2022-08-20 VITALS — BP 116/88 | Ht 63.0 in | Wt 107.5 lb

## 2022-08-20 DIAGNOSIS — Z3202 Encounter for pregnancy test, result negative: Secondary | ICD-10-CM

## 2022-08-20 LAB — PREGNANCY, URINE: Preg Test, Ur: NEGATIVE

## 2022-08-20 NOTE — Progress Notes (Signed)
UPT negative. Negative test results explained and negative preg packet given. LMP 07/09/2022. Last sex yesterday. Patient explains she has been trying to get pregnant and wants to know whether she can get pregnant. States hx of having "tubes tied" then had an ectopic pregnancy and had "one tube removed." Advised to f-u with PCP.  During visit while obtaining medical hx, patient started to softly cry and says she cannot stay to complete visit. States she plans to see PCP at [place] to follow up. Josie Saunders, RN

## 2022-08-28 DIAGNOSIS — Z800 Family history of malignant neoplasm of digestive organs: Secondary | ICD-10-CM | POA: Diagnosis not present

## 2022-08-28 DIAGNOSIS — Z6820 Body mass index (BMI) 20.0-20.9, adult: Secondary | ICD-10-CM | POA: Diagnosis not present

## 2022-12-03 DIAGNOSIS — Z681 Body mass index (BMI) 19 or less, adult: Secondary | ICD-10-CM | POA: Diagnosis not present

## 2023-01-03 DIAGNOSIS — Z5320 Procedure and treatment not carried out because of patient's decision for unspecified reasons: Secondary | ICD-10-CM | POA: Diagnosis not present

## 2023-01-03 DIAGNOSIS — M2550 Pain in unspecified joint: Secondary | ICD-10-CM | POA: Diagnosis not present

## 2023-03-19 DIAGNOSIS — Z8673 Personal history of transient ischemic attack (TIA), and cerebral infarction without residual deficits: Secondary | ICD-10-CM | POA: Diagnosis not present

## 2023-03-19 DIAGNOSIS — R202 Paresthesia of skin: Secondary | ICD-10-CM | POA: Diagnosis not present

## 2023-03-19 DIAGNOSIS — R200 Anesthesia of skin: Secondary | ICD-10-CM | POA: Diagnosis not present

## 2023-03-19 DIAGNOSIS — R29898 Other symptoms and signs involving the musculoskeletal system: Secondary | ICD-10-CM | POA: Diagnosis not present

## 2023-03-19 DIAGNOSIS — M79605 Pain in left leg: Secondary | ICD-10-CM | POA: Diagnosis not present

## 2023-05-02 DIAGNOSIS — M79672 Pain in left foot: Secondary | ICD-10-CM | POA: Diagnosis not present

## 2023-05-02 DIAGNOSIS — S93602A Unspecified sprain of left foot, initial encounter: Secondary | ICD-10-CM | POA: Diagnosis not present

## 2023-05-15 ENCOUNTER — Ambulatory Visit: Payer: Medicare Other | Admitting: Podiatry

## 2023-06-10 ENCOUNTER — Emergency Department
Admission: EM | Admit: 2023-06-10 | Discharge: 2023-06-10 | Disposition: A | Discharge status: Home / Self Care | Payer: 59 | Attending: Emergency Medicine | Admitting: Emergency Medicine

## 2023-06-10 ENCOUNTER — Other Ambulatory Visit: Payer: Self-pay

## 2023-06-10 ENCOUNTER — Encounter: Payer: Self-pay | Admitting: Emergency Medicine

## 2023-06-10 DIAGNOSIS — L0291 Cutaneous abscess, unspecified: Secondary | ICD-10-CM

## 2023-06-10 DIAGNOSIS — L0231 Cutaneous abscess of buttock: Secondary | ICD-10-CM | POA: Diagnosis not present

## 2023-06-10 DIAGNOSIS — R21 Rash and other nonspecific skin eruption: Secondary | ICD-10-CM | POA: Diagnosis present

## 2023-06-10 MED ORDER — SULFAMETHOXAZOLE-TRIMETHOPRIM 800-160 MG PO TABS
1.0000 | ORAL_TABLET | Freq: Once | ORAL | Status: AC
  Administered 2023-06-10: 1 via ORAL
  Filled 2023-06-10: qty 1

## 2023-06-10 MED ORDER — IBUPROFEN 600 MG PO TABS
600.0000 mg | ORAL_TABLET | Freq: Once | ORAL | Status: AC
  Administered 2023-06-10: 600 mg via ORAL
  Filled 2023-06-10: qty 1

## 2023-06-10 MED ORDER — SULFAMETHOXAZOLE-TRIMETHOPRIM 800-160 MG PO TABS: 1.0000 | ORAL_TABLET | Freq: Two times a day (BID) | ORAL | 0 refills | Status: AC

## 2023-06-10 NOTE — ED Triage Notes (Addendum)
Patient ambulatory to triage with steady gait, without difficulty or distress noted; pt reports having a "sore or bite" to rt buttock since yesterday;

## 2023-06-10 NOTE — ED Provider Notes (Signed)
[facility] Provider Note    Event Date/Time   First MD Initiated Contact with Patient 06/10/23 [phone]     (approximate)   History   Rash   HPI Sheri Woods is a 37 y.o. female who presents for evaluation of a lesion to her right buttock.  She said that it is painful and has been gradually getting worse for days.  She said that she opened it herself within the last day or so and got a large amount of purulent material from the area, but it continues to hurt.  She has not had any fever, chest pain, nor shortness of breath.  She is unaware of having anything bite her.  No other lesions elsewhere on her body.     Physical Exam   Triage Vital Signs: ED Triage Vitals  Encounter Vitals Group     BP 06/10/23 0134 (!) 136/94     Systolic BP Percentile --      Diastolic BP Percentile --      Pulse Rate 06/10/23 0134 (!) 107     Resp 06/10/23 0134 19     Temp 06/10/23 0134 97.8 F (36.6 C)     Temp src --      SpO2 06/10/23 0134 100 %     Weight 06/10/23 0126 49.9 kg (110 lb)     Height 06/10/23 0126 1.6 m (5\' 3" )     Head Circumference --      Peak Flow --      Pain Score 06/10/23 0126 6     Pain Loc --      Pain Education --      Exclude from Growth Chart --     Most recent vital signs: Vitals:   06/10/23 0134  BP: (!) 136/94  Pulse: (!) 107  Resp: 19  Temp: 97.8 F (36.6 C)  SpO2: 100%    General: Awake, no distress.  CV:  Good peripheral perfusion.  Resp:  Normal effort. Speaking easily and comfortably, no accessory muscle usage nor intercostal retractions.   Abd:  No distention.  Other:  Small erythematous lesion approximately 2 cm in diameter on her right buttock.  There is an area in the center that appears like it had drained material but no longer.  There is minimal induration and no fluctuance.  No surrounding cellulitis.   ED Results / Procedures / Treatments   Labs (all labs ordered are listed, but only abnormal results are  displayed) Labs Reviewed - No data to display     PROCEDURES:  Critical Care performed: No  Procedures    IMPRESSION / MDM / ASSESSMENT AND PLAN / ED COURSE  I reviewed the triage vital signs and the nursing notes.                              Differential diagnosis includes, but is not limited to, abscess, pilonidal cyst, nonspecific rash.  Patient's presentation is most consistent with acute, uncomplicated illness.  Interventions/Medications given:  Medications  sulfamethoxazole-trimethoprim (BACTRIM DS) 800-160 MG per tablet 1 tablet (1 tablet Oral Given 06/10/23 0359)  ibuprofen (ADVIL) tablet 600 mg (600 mg Oral Given 06/10/23 0359)    (Note:  hospital course my include additional interventions and/or labs/studies not listed above.)   Patient initially was slightly tachycardic but her heart rate resolved to normal without intervention at the time that I saw her.  She has what appears  to be a healing cutaneous abscess on her right buttock which she seems to have drained most of the purulent material from herself.  She agreed with my plan for a course of Bactrim and I started the treatment in the emergency department.  She has no primary care provider so I also ordered the PCP follow-up.  I gave my usual and customary return precautions.     Clinical Course as of 06/10/23 0558  Tue Jun 10, 2023  0423 Of note, I reviewed the [place] consult substance database and see that the patient gets regular prescriptions from alprazolam.  Given the generally reassuring workup and concerns over mixing narcotics with benzodiazepines, I think it is best that the patient use over-the-counter pain medication rather than adding narcotics to the mix. [CF]    Clinical Course User Index [CF] Loleta Rose, MD     FINAL CLINICAL IMPRESSION(S) / ED DIAGNOSES   Final diagnoses:  Abscess     Rx / DC Orders   ED Discharge Orders          Ordered    Ambulatory Referral to  Primary Care (Establish Care)        06/10/23 0420    sulfamethoxazole-trimethoprim (BACTRIM DS) 800-160 MG tablet  2 times daily        06/10/23 0428             Note:  This document was prepared using Dragon voice recognition software and may include unintentional dictation errors.   Loleta Rose, MD 06/10/23 [phone]

## 2023-06-10 NOTE — Discharge Instructions (Addendum)
You already got most of the infection out, but please take the full course of antibiotics as prescribed to help treat any residual infection.  He was over-the-counter ibuprofen and/or Tylenol as needed for pain control and the pain should get better as the wound heals.  You can also use heating pads to try additional blood supply to the area to help it heal.  We provided a referral to the primary care service in the area and they should reach out to you to help find you an appropriate provider.    Return to the emergency department if you develop new or worsening symptoms that concern you.

## 2023-07-25 ENCOUNTER — Ambulatory Visit: Payer: 59 | Admitting: Family Medicine

## 2023-07-29 ENCOUNTER — Ambulatory Visit (INDEPENDENT_AMBULATORY_CARE_PROVIDER_SITE_OTHER): Payer: 59 | Admitting: Family Medicine

## 2023-07-29 ENCOUNTER — Encounter: Payer: Self-pay | Admitting: Family Medicine

## 2023-07-29 VITALS — BP 127/88 | HR 81 | Temp 97.5°F | Resp 14 | Ht 63.0 in | Wt 104.2 lb

## 2023-07-29 DIAGNOSIS — K047 Periapical abscess without sinus: Secondary | ICD-10-CM | POA: Insufficient documentation

## 2023-07-29 DIAGNOSIS — Z0000 Encounter for general adult medical examination without abnormal findings: Secondary | ICD-10-CM | POA: Insufficient documentation

## 2023-07-29 DIAGNOSIS — F419 Anxiety disorder, unspecified: Secondary | ICD-10-CM | POA: Insufficient documentation

## 2023-07-29 DIAGNOSIS — I10 Essential (primary) hypertension: Secondary | ICD-10-CM

## 2023-07-29 MED ORDER — LISINOPRIL 10 MG PO TABS: 10.0000 mg | ORAL_TABLET | Freq: Every day | ORAL | 1 refills | Status: DC

## 2023-07-29 MED ORDER — ALPRAZOLAM 0.5 MG PO TABS: 0.5000 mg | ORAL_TABLET | Freq: Two times a day (BID) | ORAL | 0 refills | Status: DC | PRN

## 2023-07-29 MED ORDER — AMLODIPINE BESYLATE 5 MG PO TABS: 5.0000 mg | ORAL_TABLET | Freq: Every day | ORAL | 1 refills | Status: DC

## 2023-07-29 MED ORDER — CLINDAMYCIN HCL 150 MG PO CAPS: 150.0000 mg | ORAL_CAPSULE | Freq: Four times a day (QID) | ORAL | 0 refills | Status: AC

## 2023-07-29 MED ORDER — SERTRALINE HCL 50 MG PO TABS: 50.0000 mg | ORAL_TABLET | Freq: Every day | ORAL | 3 refills | Status: DC

## 2023-07-29 NOTE — Assessment & Plan Note (Signed)
 Blood pressure well-controlled.  Refill amlodipine 5 mg and lisinopril 10 mg.

## 2023-07-29 NOTE — Assessment & Plan Note (Signed)
 She is having fleeting thoughts of suicide but no plan.  Promises me she would not kill herself.  Gave her information on suicide hotline.  Restart Zoloft 50 mg daily and Xanax 0.5 twice daily as needed.  Follow-up in a month.

## 2023-07-29 NOTE — Assessment & Plan Note (Addendum)
 No fever.  Tooth broken off at the gumline.  Allergic to penicillin.  Clindamycin 150 4 times daily for 7 days.  Do warm salt water gargles.  Take ibuprofen 600 mg every 6-8 hours for pain.

## 2023-07-29 NOTE — Assessment & Plan Note (Signed)
 Ask her to get scheduled for a Pap smear.

## 2023-07-29 NOTE — Progress Notes (Signed)
 Established Patient Office Visit  Subjective   Patient ID: Sheri Woods, female    DOB: August 30, 1985  Age: 38 y.o. MRN: [id number]  Chief Complaint  Patient presents with   Medical Management of Chronic Issues   Establish Care   Dental Pain    X3 days/Left back    HPI Sheri Woods is very emotional in the office today, her son was doing court this morning.  She has got an abscessed tooth that is causing her a great deal of pain.  She list her pain is a 10 out of 10.  She is allergic to penicillin.  She has found a dentist that takes her insurance She has been off her Zoloft  for a number of years.  She has fleeting thoughts of suicide but no plan.  She just feels overwhelmed between her abscessed tooth and her son situation. PHQ-9-1 and GAD-7-3. BP well-controlled at 127/88 she is on Norvasc  5 mg and lisinopril  10 mg. She has not had a Pap in a number of years.    ROS    Objective:     BP 127/88 (BP Location: Right Arm, Patient Position: Sitting, Cuff Size: Normal)   Pulse 81   Temp (!) 97.5 F (36.4 C) (Oral)   Resp 14   Ht 5' 3 (1.6 m)   Wt 104 lb 3.2 oz (47.3 kg)   SpO2 98%   BMI 18.46 kg/m    Physical Exam HENT:     Mouth/Throat:     Pharynx: Posterior oropharyngeal erythema present.          No results found for any visits on 07/29/23.    The ASCVD Risk score (Arnett DK, et al., 2019) failed to calculate for the following reasons:   The 2019 ASCVD risk score is only valid for ages 28 to 44   Risk score cannot be calculated because patient has a medical history suggesting prior/existing ASCVD    Assessment & Plan:  Anxiety Assessment & Plan: She is having fleeting thoughts of suicide but no plan.  Promises me she would not kill herself.  Gave her information on suicide hotline.  Restart Zoloft  50 mg daily and Xanax  0.5 twice daily as needed.  Follow-up in a month.  Orders: -     ALPRAZolam ; Take 1 tablet (0.5 mg total) by mouth 2 (two) times daily as  needed for anxiety.  Dispense: 60 tablet; Refill: 0 -     Sertraline  HCl; Take 1 tablet (50 mg total) by mouth daily.  Dispense: 30 tablet; Refill: 3  Hypertension, essential Assessment & Plan: Blood pressure well-controlled.  Refill amlodipine  5 mg and lisinopril  10 mg.  Orders: -     amLODIPine  Besylate; Take 1 tablet (5 mg total) by mouth daily.  Dispense: 90 tablet; Refill: 1 -     Lisinopril ; Take 1 tablet (10 mg total) by mouth daily.  Dispense: 90 tablet; Refill: 1  Dental abscess Assessment & Plan: No fever.  Tooth broken off at the gumline.  Allergic to penicillin.  Clindamycin  150 4 times daily for 7 days.  Do warm salt water gargles.  Take ibuprofen  600 mg every 6-8 hours for pain.  Orders: -     Clindamycin  HCl; Take 1 capsule (150 mg total) by mouth 4 (four) times daily for 7 days.  Dispense: 28 capsule; Refill: 0  Healthcare maintenance Assessment & Plan: Ask her to get scheduled for a Pap smear.      Return in about 4 weeks (around  08/26/2023).    Sheri Karges K Arayla Kruschke, MD

## 2023-08-25 ENCOUNTER — Ambulatory Visit (INDEPENDENT_AMBULATORY_CARE_PROVIDER_SITE_OTHER): Payer: 59 | Admitting: Family Medicine

## 2023-08-25 ENCOUNTER — Encounter: Payer: Self-pay | Admitting: Family Medicine

## 2023-08-25 VITALS — BP 133/90 | HR 96 | Temp 97.6°F | Resp 12 | Ht 63.0 in | Wt 99.4 lb

## 2023-08-25 DIAGNOSIS — D509 Iron deficiency anemia, unspecified: Secondary | ICD-10-CM | POA: Diagnosis not present

## 2023-08-25 DIAGNOSIS — I10 Essential (primary) hypertension: Secondary | ICD-10-CM

## 2023-08-25 DIAGNOSIS — F419 Anxiety disorder, unspecified: Secondary | ICD-10-CM

## 2023-08-25 DIAGNOSIS — N898 Other specified noninflammatory disorders of vagina: Secondary | ICD-10-CM

## 2023-08-25 MED ORDER — LISINOPRIL 10 MG PO TABS: 10.0000 mg | ORAL_TABLET | Freq: Every day | ORAL | 1 refills | Status: DC

## 2023-08-25 MED ORDER — AMLODIPINE BESYLATE 5 MG PO TABS: 5.0000 mg | ORAL_TABLET | Freq: Every day | ORAL | 1 refills | Status: DC

## 2023-08-25 MED ORDER — SERTRALINE HCL 50 MG PO TABS
50.0000 mg | ORAL_TABLET | Freq: Every day | ORAL | 3 refills | Status: DC
Start: 2023-08-25 — End: 2023-09-23

## 2023-08-25 NOTE — Progress Notes (Signed)
 Established Patient Office Visit  Subjective   Patient ID: Sheri Woods, female    DOB: 1985-07-21  Age: 38 y.o. MRN: [id number]  Chief Complaint  Patient presents with   Medical Management of Chronic Issues   Vaginitis    HPI 38 yo here for FU of anxiety.  Zoloft  is helping as she feels a lot more calm but still have anxiety.  Taking xanax  BID some days and once a day other days.  It just depends on how much stress at home.  She is smoking much less.  A pack lasts for 4-5 days now.  She asks for her xanax  to be refilled.  Checked her PDMP and she last got a prescription filled 07/29/2023.  She has lost her BP medication and needs this sent to the pharmacy.  Advised her insurance may not cover her medication.. She reports feeling like she has a vaginal yeast infection for about a day now.   She has itching and burning.       ROS    Objective:     BP (!) 133/90 (BP Location: Right Arm, Patient Position: Sitting, Cuff Size: Normal)   Pulse 96   Temp 97.6 F (36.4 C) (Oral)   Resp 12   Ht 5\' 3"  (1.6 m) Comment: per patient  Wt 99 lb 6.4 oz (45.1 kg)   SpO2 98%   BMI 17.61 kg/m    Physical Exam Vitals and nursing note reviewed.  Constitutional:      Appearance: Normal appearance.  HENT:     Head: Normocephalic and atraumatic.  Eyes:     Conjunctiva/sclera: Conjunctivae normal.  Cardiovascular:     Rate and Rhythm: Normal rate and regular rhythm.  Pulmonary:     Effort: Pulmonary effort is normal.     Breath sounds: Normal breath sounds.  Musculoskeletal:     Right lower leg: No edema.     Left lower leg: No edema.  Skin:    General: Skin is warm and dry.  Neurological:     Mental Status: She is alert and oriented to person, place, and time.  Psychiatric:        Mood and Affect: Mood normal.        Behavior: Behavior normal.        Thought Content: Thought content normal.        Judgment: Judgment normal.          No results found for any visits on  08/25/23.    The ASCVD Risk score (Arnett DK, et al., 2019) failed to calculate for the following reasons:   The 2019 ASCVD risk score is only valid for ages 49 to 51   Risk score cannot be calculated because patient has a medical history suggesting prior/existing ASCVD    Assessment & Plan:  Iron deficiency anemia, unspecified iron deficiency anemia type Assessment & Plan: She has a history of anemia and has a soft SEM.  Not on contraception and had a BTL.  Periods are monthly and last 3-7 days.  Will check her CBC, ferritin, TIBC  Orders: -     Iron, TIBC and Ferritin Panel -     CBC with Differential/Platelet  Hypertension, essential Assessment & Plan: Amlodipine  5mg  and lisinopril  10mg .    Orders: -     amLODIPine  Besylate; Take 1 tablet (5 mg total) by mouth daily.  Dispense: 90 tablet; Refill: 1 -     Lisinopril ; Take 1 tablet (10 mg total) by mouth  daily.  Dispense: 90 tablet; Refill: 1  Anxiety Assessment & Plan: Advised that she cannot get her xanax  refilled until 08/27/2023.  Continue zoloft  50mg  daily.    Orders: -     Sertraline  HCl; Take 1 tablet (50 mg total) by mouth daily.  Dispense: 30 tablet; Refill: 3  Vaginal discharge -     NuSwab Vaginitis Plus (VG+)     No follow-ups on file.    Turon Kilmer K Ernestyne Caldwell, MD

## 2023-08-25 NOTE — Assessment & Plan Note (Signed)
 Amlodipine  5mg  and lisinopril  10mg .

## 2023-08-25 NOTE — Assessment & Plan Note (Addendum)
 Advised that she cannot get her xanax  refilled until 08/27/2023.  Continue zoloft  50mg  daily.

## 2023-08-25 NOTE — Assessment & Plan Note (Addendum)
 She has a history of anemia and has a soft SEM.  Not on contraception and had a BTL.  Periods are monthly and last 3-7 days.  Will check her CBC, ferritin, TIBC

## 2023-08-26 LAB — IRON,TIBC AND FERRITIN PANEL
Ferritin: 30 ng/mL (ref 15–150)
Iron Saturation: 17 % (ref 15–55)
Iron: 65 ug/dL (ref 27–159)
Total Iron Binding Capacity: 383 ug/dL (ref 250–450)
UIBC: 318 ug/dL (ref 131–425)

## 2023-08-26 LAB — CBC WITH DIFFERENTIAL/PLATELET
Basophils Absolute: 0.1 10*3/uL (ref 0.0–0.2)
Basos: 1 %
EOS (ABSOLUTE): 0.2 10*3/uL (ref 0.0–0.4)
Eos: 2 %
Hematocrit: 35.8 % (ref 34.0–46.6)
Hemoglobin: 11.8 g/dL (ref 11.1–15.9)
Immature Grans (Abs): 0 10*3/uL (ref 0.0–0.1)
Immature Granulocytes: 0 %
Lymphocytes Absolute: 4.7 10*3/uL — ABNORMAL HIGH (ref 0.7–3.1)
Lymphs: 41 %
MCH: 28.5 pg (ref 26.6–33.0)
MCHC: 33 g/dL (ref 31.5–35.7)
MCV: 87 fL (ref 79–97)
Monocytes Absolute: 1.2 10*3/uL — ABNORMAL HIGH (ref 0.1–0.9)
Monocytes: 11 %
Neutrophils Absolute: 5.2 10*3/uL (ref 1.4–7.0)
Neutrophils: 45 %
Platelets: 346 10*3/uL (ref 150–450)
RBC: 4.14 x10E6/uL (ref 3.77–5.28)
RDW: 12.8 % (ref 11.7–15.4)
WBC: 11.4 10*3/uL — ABNORMAL HIGH (ref 3.4–10.8)

## 2023-08-29 ENCOUNTER — Other Ambulatory Visit: Payer: Self-pay | Admitting: Family Medicine

## 2023-08-29 DIAGNOSIS — F419 Anxiety disorder, unspecified: Secondary | ICD-10-CM

## 2023-08-29 LAB — NUSWAB VAGINITIS PLUS (VG+)
Candida albicans, NAA: NEGATIVE
Candida glabrata, NAA: NEGATIVE
Chlamydia trachomatis, NAA: NEGATIVE
Neisseria gonorrhoeae, NAA: NEGATIVE
Trich vag by NAA: NEGATIVE

## 2023-08-29 NOTE — Telephone Encounter (Signed)
Last Fill: 07/29/23 60 tabs/ 0 refills  Last OV: 08/25/23 Next OV: None Scheduled  Routing to provider for review/authorization.

## 2023-08-29 NOTE — Telephone Encounter (Signed)
Copied from CRM [id number]. Topic: Clinical - Medication Refill >> Aug 29, 2023  4:50 PM Eunice Blase wrote: Most Recent Primary Care Visit:   Medication: ALPRAZolam [name]) 0.5 MG tablet   Has the patient contacted their pharmacy? Yes (Agent: If no, request that the patient contact the pharmacy for the refill. If patient does not wish to contact the pharmacy document the reason why and proceed with request.) (Agent: If yes, when and what did the pharmacy advise?)Pharmacy stated still did not receive  Is this the correct pharmacy for this prescription? Yes If no, delete pharmacy and type the correct one.  This is the patient's preferred pharmacy:    [facility] - [place], [place] - [address] [place] [address] Phone: [phone] Fax: [phone]   Has the prescription been filled recently? Yes  Is the patient out of the medication? Yes  Has the patient been seen for an appointment in the last year OR does the patient have an upcoming appointment? Yes  Can we respond through MyChart? Yes  Agent: Please be advised that Rx refills may take up to 3 business days. We ask that you follow-up with your pharmacy.

## 2023-08-29 NOTE — Telephone Encounter (Signed)
Copied from CRM [id number]. Topic: Clinical - Medication Refill >> Aug 29, 2023  4:54 PM Eunice Blase wrote: Most Recent Primary Care Visit:   Medication: ALPRAZolam [name]) 0.5 MG tablet  Has the patient contacted their pharmacy? Yes (Agent: If no, request that the patient contact the pharmacy for the refill. If patient does not wish to contact the pharmacy document the reason why and proceed with request.) (Agent: If yes, when and what did the pharmacy advise?)  Is this the correct pharmacy for this prescription? Yes If no, delete pharmacy and type the correct one.  This is the patient's preferred pharmacy:    [facility] - [place], [place] - [address] [place] [address] Phone: [phone] Fax: [phone]   Has the prescription been filled recently? Yes  Is the patient out of the medication? Yes  Has the patient been seen for an appointment in the last year OR does the patient have an upcoming appointment? Yes  Can we respond through MyChart? Yes  Agent: Please be advised that Rx refills may take up to 3 business days. We ask that you follow-up with your pharmacy.

## 2023-09-01 MED ORDER — ALPRAZOLAM 0.5 MG PO TABS: 0.5000 mg | ORAL_TABLET | Freq: Two times a day (BID) | ORAL | 0 refills | Status: DC | PRN

## 2023-09-01 NOTE — Telephone Encounter (Signed)
Copied from CRM [id number]. Topic: Clinical - Medication Question >> Sep 01, 2023 12:05 PM Patsy Lager T wrote: Reason for CRM: Loraine Leriche called stated patient really needs her medication ALPRAZolam (XANAX) 0.5 MG tablet. Please f/u with patient

## 2023-09-02 ENCOUNTER — Telehealth: Payer: Self-pay

## 2023-09-02 NOTE — Telephone Encounter (Signed)
Copied from CRM [id number]. Topic: Clinical - Prescription Issue >> Sep 02, 2023  1:32 PM Geroge Baseman wrote: Reason for CRM: Patient is waiting on refills of alprazolam, I can see it was sent over. Let them know it may be waiting on insurance. Please advise patient if there are any issues. Call dropped and tried to call back to confirm I was sending this message but I could not get an answer.   Confirmed this is at [facility] and awaiting patient pick up. Please advise patient if she returns call. Unable to reach when attempted.

## 2023-09-22 ENCOUNTER — Other Ambulatory Visit: Payer: Self-pay | Admitting: Family Medicine

## 2023-09-22 DIAGNOSIS — F419 Anxiety disorder, unspecified: Secondary | ICD-10-CM

## 2023-09-23 ENCOUNTER — Ambulatory Visit (INDEPENDENT_AMBULATORY_CARE_PROVIDER_SITE_OTHER): Admitting: Family Medicine

## 2023-09-23 ENCOUNTER — Encounter: Payer: Self-pay | Admitting: Family Medicine

## 2023-09-23 VITALS — BP 111/78 | HR 87 | Temp 97.7°F | Resp 18 | Ht 63.0 in | Wt 100.6 lb

## 2023-09-23 DIAGNOSIS — K0889 Other specified disorders of teeth and supporting structures: Secondary | ICD-10-CM | POA: Diagnosis not present

## 2023-09-23 DIAGNOSIS — M25519 Pain in unspecified shoulder: Secondary | ICD-10-CM | POA: Insufficient documentation

## 2023-09-23 DIAGNOSIS — M25511 Pain in right shoulder: Secondary | ICD-10-CM | POA: Diagnosis not present

## 2023-09-23 DIAGNOSIS — F419 Anxiety disorder, unspecified: Secondary | ICD-10-CM

## 2023-09-23 DIAGNOSIS — I10 Essential (primary) hypertension: Secondary | ICD-10-CM | POA: Diagnosis not present

## 2023-09-23 DIAGNOSIS — S29012A Strain of muscle and tendon of back wall of thorax, initial encounter: Secondary | ICD-10-CM | POA: Insufficient documentation

## 2023-09-23 MED ORDER — CLINDAMYCIN HCL 150 MG PO CAPS: 150.0000 mg | ORAL_CAPSULE | Freq: Three times a day (TID) | ORAL | 0 refills | Status: AC

## 2023-09-23 MED ORDER — ALPRAZOLAM 0.5 MG PO TABS: 0.5000 mg | ORAL_TABLET | Freq: Two times a day (BID) | ORAL | 1 refills | Status: DC | PRN

## 2023-09-23 MED ORDER — BACLOFEN 20 MG PO TABS: 20.0000 mg | ORAL_TABLET | Freq: Three times a day (TID) | ORAL | 0 refills | Status: DC

## 2023-09-23 NOTE — Assessment & Plan Note (Signed)
 She is allergic to PCN and develops a rash and hives.  Clindamycin 150 TID for 10 days.  She has found a dentist who will pull the tooth.

## 2023-09-23 NOTE — Telephone Encounter (Signed)
 Copied from CRM [id number]. Topic: Clinical - Medication Question >> Sep 22, 2023  5:35 PM [name] wrote: Reason for CRM: Patient would like to know if she needs schedule a new appt for a refill on ALPRAZolam [name]) 0.5 MG tablet, Patient would like a call back >> Sep 22, 2023  5:46 PM Shelah Lewandowsky wrote: [phone] is the new phone number

## 2023-09-23 NOTE — Assessment & Plan Note (Signed)
 She is taking her amlodipine.

## 2023-09-23 NOTE — Assessment & Plan Note (Signed)
 Has acute shoulder pain right.  Has to lower her right arm with her left arm because it is so sore.  Offered for her to go to Ortho and she declined.

## 2023-09-23 NOTE — Assessment & Plan Note (Signed)
 She has stopped taking her Zoloft.  Finances are a problem so she just pays for Xanax.  She takes 0.5 mg 2 a day advised this prescription cannot be filled until 09/29/2023.

## 2023-09-23 NOTE — Assessment & Plan Note (Addendum)
 Recommended Topical pain relievers: Tiger balm, Salonpas, etc.  Will try Baclofen.

## 2023-09-23 NOTE — Progress Notes (Signed)
 Established Patient Office Visit  Subjective   Patient ID: Sheri Woods, female    DOB: 20-Nov-1985  Age: 38 y.o. MRN: [id number]  Chief Complaint  Patient presents with   Medical Management of Chronic Issues    HPI 38 year old with iron deficiency anemia, anxiety, dental pain, polyarthralgia, dysmenorrhea. Please got dental pain left lower back molar.  Has found a dentist but cannot get there for a week.  She is allergic to penicillin and breaks out in a rash and hives. Request her Xanax be refilled.  Is no longer taking her Zoloft.  Does not feel like her Zoloft helped very much and finances are tight.  Last fill of Xanax was 09/01/2023.  Advised she will have to wait 6 days to get her Xanax filled. She is taking her amlodipine 5 mg for blood pressure. Having a lot of pain with her right shoulder with certain movements.  Abduction is excruciating and she uses her left hand to lower her right arm.  She denies trauma.  She has normal range of motion otherwise.  She has got pain in her back in the rhomboid area.     ROS    Objective:     BP 111/78 (BP Location: Left Arm, Patient Position: Sitting, Cuff Size: Normal)   Pulse 87   Temp 97.7 F (36.5 C) (Oral)   Resp 18   Ht 5\' 3"  (1.6 m)   Wt 100 lb 9.6 oz (45.6 kg)   LMP  (LMP Unknown)   SpO2 99%   Breastfeeding No   BMI 17.82 kg/m    Physical Exam Vitals and nursing note reviewed.  Constitutional:      Appearance: Normal appearance.  HENT:     Head: Normocephalic and atraumatic.  Eyes:     Conjunctiva/sclera: Conjunctivae normal.  Cardiovascular:     Rate and Rhythm: Normal rate and regular rhythm.  Pulmonary:     Effort: Pulmonary effort is normal.     Breath sounds: Normal breath sounds.  Musculoskeletal:     Right lower leg: No edema.     Left lower leg: No edema.  Skin:    General: Skin is warm and dry.  Neurological:     Mental Status: She is alert and oriented to person, place, and time.     Comments:  Active FROM of right shoulder but pain with ABDUCTION.  Psychiatric:        Mood and Affect: Mood normal.        Behavior: Behavior normal.        Thought Content: Thought content normal.        Judgment: Judgment normal.          No results found for any visits on 09/23/23.    The ASCVD Risk score (Arnett DK, et al., 2019) failed to calculate for the following reasons:   The 2019 ASCVD risk score is only valid for ages 57 to 55   Risk score cannot be calculated because patient has a medical history suggesting prior/existing ASCVD    Assessment & Plan:  Pain, dental Assessment & Plan: She is allergic to PCN and develops a rash and hives.  Clindamycin 150 TID for 10 days.  She has found a dentist who will pull the tooth.    Orders: -     Clindamycin HCl; Take 1 capsule (150 mg total) by mouth 3 (three) times daily for 7 days.  Dispense: 21 capsule; Refill: 0  Rhomboid muscle strain, initial encounter  Assessment & Plan: Recommended Topical pain relievers: Tiger balm, Salonpas, etc.  Will try Baclofen.    Orders: -     Baclofen; Take 1 tablet (20 mg total) by mouth 3 (three) times daily.  Dispense: 30 each; Refill: 0  Anxiety Assessment & Plan: She has stopped taking her Zoloft.  Finances are a problem so she just pays for Xanax.  She takes 0.5 mg 2 a day advised this prescription cannot be filled until 09/29/2023.     Hypertension, essential Assessment & Plan: She is taking her amlodipine.     Acute pain of right shoulder Assessment & Plan: Has acute shoulder pain right.  Has to lower her right arm with her left arm because it is so sore.  Offered for her to go to Ortho and she declined.       Return in about 2 months (around 11/23/2023).    Alease Medina, MD

## 2023-09-23 NOTE — Telephone Encounter (Signed)
 LVM informing patient that she will need to schedule OV. Per Dr. Girtha Rm, patient was to follow up in 4 weeks( 08/26/23).

## 2023-11-18 ENCOUNTER — Other Ambulatory Visit: Payer: Self-pay | Admitting: Family Medicine

## 2023-11-18 DIAGNOSIS — F419 Anxiety disorder, unspecified: Secondary | ICD-10-CM

## 2023-11-18 NOTE — Telephone Encounter (Signed)
 Copied from CRM [id number]. Topic: Clinical - Prescription Issue >> Nov 18, 2023  4:15 PM Crispin Dolphin wrote: Reason for CRM: Patient called. States pharmacy told her to reach out about Refill request. They submitted refill request today for ALPRAZolam  (XANAX ) 0.5 MG tablet. Patient called to check status. Thank You

## 2023-11-20 ENCOUNTER — Ambulatory Visit (INDEPENDENT_AMBULATORY_CARE_PROVIDER_SITE_OTHER): Admitting: Family Medicine

## 2023-11-20 ENCOUNTER — Encounter: Payer: Self-pay | Admitting: Family Medicine

## 2023-11-20 VITALS — BP 139/92 | HR 90 | Temp 97.9°F | Resp 20 | Ht 63.0 in | Wt 99.0 lb

## 2023-11-20 DIAGNOSIS — Z0000 Encounter for general adult medical examination without abnormal findings: Secondary | ICD-10-CM

## 2023-11-20 DIAGNOSIS — G894 Chronic pain syndrome: Secondary | ICD-10-CM

## 2023-11-20 DIAGNOSIS — F419 Anxiety disorder, unspecified: Secondary | ICD-10-CM

## 2023-11-20 DIAGNOSIS — I10 Essential (primary) hypertension: Secondary | ICD-10-CM

## 2023-11-20 DIAGNOSIS — S63639A Sprain of interphalangeal joint of unspecified finger, initial encounter: Secondary | ICD-10-CM

## 2023-11-20 DIAGNOSIS — M152 Bouchard's nodes (with arthropathy): Secondary | ICD-10-CM

## 2023-11-20 DIAGNOSIS — Z800 Family history of malignant neoplasm of digestive organs: Secondary | ICD-10-CM

## 2023-11-20 DIAGNOSIS — M21611 Bunion of right foot: Secondary | ICD-10-CM

## 2023-11-20 DIAGNOSIS — Z1231 Encounter for screening mammogram for malignant neoplasm of breast: Secondary | ICD-10-CM

## 2023-11-20 MED ORDER — GABAPENTIN 100 MG PO CAPS: 200.0000 mg | ORAL_CAPSULE | Freq: Two times a day (BID) | ORAL | 2 refills | Status: DC

## 2023-11-20 MED ORDER — ALPRAZOLAM 0.5 MG PO TABS: 0.5000 mg | ORAL_TABLET | Freq: Two times a day (BID) | ORAL | 2 refills | Status: DC | PRN

## 2023-11-20 NOTE — Assessment & Plan Note (Signed)
 Refilled gabapentin 100 mg 1 or 2 p.o. daily.

## 2023-11-20 NOTE — Assessment & Plan Note (Signed)
 She reports father had colon cancer at age 40.  She will need to initiate screening at age 21 instead of waiting to age 32.

## 2023-11-20 NOTE — Assessment & Plan Note (Signed)
 Blood pressure slightly elevated.  On amlodipine  5 and lisinopril  5 mg daily (takes half of the 10 mg tablet)

## 2023-11-20 NOTE — Assessment & Plan Note (Signed)
 Needs a mammogram.  No family history of breast cancer

## 2023-11-20 NOTE — Assessment & Plan Note (Signed)
 Last fill date for her Xanax  was 10/23/2023.  Refilled her Xanax  0.5 twice daily #60 with 2 refills.  She has Zoloft  but does not take it except as needed for crying spells

## 2023-11-20 NOTE — Assessment & Plan Note (Addendum)
 Has a laceration across the PIP third finger left.  Healing by secondary intention.  Tells me she cannot flex or extend the finger.  Referral to orthopedics

## 2023-11-20 NOTE — Progress Notes (Signed)
 Established Patient Office Visit  Subjective   Patient ID: Sheri Woods, female    DOB: 06/17/1986  Age: 38 y.o. MRN: [id number]  Chief Complaint  Patient presents with   Medical Management of Chronic Issues    HPI 38 year old woman with anxiety, HTN, iron deficiency anemia, migraine headaches, psychogenic seizure disorder. She is on Xanax  for anxiety and generally takes 0.5 mg twice a day.  Has a prescription for Zoloft  25 mg which she takes as needed.  If she feels depressed or has a crying spell then she takes Xanax  and it works for her. Reports she is taking her amlodipine  5 mg and lisinopril  but takes one-half lisinopril  daily. Needs a refill of her gabapentin typically takes 100 mg once or twice a day.  She takes this for multiple pains including left elbow knees fingers and back.  Denies any numbness or tingling.  Denies lancing pains in either arms or legs. Last pap test was 6-7 years ago at the [facility]. Shows me the bunion on her right great toe and asked to go to podiatry. Father had colon cancer at age 40.  She has never had a mammogram.  No family history of breast cancer.     ROS    Objective:     BP (!) 139/92 (BP Location: Right Arm, Patient Position: Sitting, Cuff Size: Normal)   Pulse 90   Temp 97.9 F (36.6 C) (Oral)   Resp 20   Ht 5\' 3"  (1.6 m)   Wt 99 lb (44.9 kg)   LMP  (LMP Unknown)   SpO2 98%   BMI 17.54 kg/m    Physical Exam Vitals and nursing note reviewed.  Constitutional:      Appearance: Normal appearance.  HENT:     Head: Normocephalic and atraumatic.  Eyes:     Conjunctiva/sclera: Conjunctivae normal.  Cardiovascular:     Rate and Rhythm: Normal rate and regular rhythm.  Pulmonary:     Effort: Pulmonary effort is normal.     Breath sounds: Normal breath sounds.  Musculoskeletal:     Right lower leg: No edema.     Left lower leg: No edema.  Skin:    General: Skin is warm and dry.  Neurological:     Mental Status: She is alert and  oriented to person, place, and time.  Psychiatric:        Mood and Affect: Mood normal.        Behavior: Behavior normal.        Thought Content: Thought content normal.        Judgment: Judgment normal.          No results found for any visits on 11/20/23.    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  Refilled her Xanax  0.5 twice daily #60 with 2 refills.  She has Zoloft  but does not take it except as needed for crying spells  Orders: -     ALPRAZolam ; Take 1 tablet (0.5 mg total) by mouth 2 (two) times daily as needed for  anxiety.  Dispense: 60 tablet; Refill: 2  Asymptomatic bunion of right great toe -     Ambulatory referral to Podiatry  Healthcare maintenance Assessment & Plan: Needs a mammogram.  No family history of breast cancer   Encounter for screening mammogram for malignant neoplasm of breast -     3D Screening Mammogram, Left and Right; Future  Hypertension, essential Assessment & Plan: Blood pressure slightly elevated.  On amlodipine  5 and lisinopril  5 mg daily (takes half of the 10 mg tablet)   Family history of colon cancer in father Assessment & Plan: She reports father had colon cancer at age 40.  She will need to initiate screening at age 38 instead of waiting to age 92. instead of waiting to age 89.   Sprain of proximal interphalangeal (PIP) joint of finger Assessment & Plan: Has a laceration across the PIP third finger left.  Healing by secondary intention.  Tells me she cannot flex or extend the finger.  Referral to orthopedics   Chronic  pain syndrome Assessment & Plan: Refilled gabapentin 100 mg 1 or 2 p.o. daily.   Other orders -     Gabapentin; Take 2 capsules (200 mg total) by mouth 2 (two) times daily.  Dispense: 60 capsule; Refill: 2     Return in about 3 months (around 02/20/2024).    Vee Bahe K Devynn Scheff, MD

## 2023-12-23 ENCOUNTER — Telehealth: Payer: Self-pay

## 2023-12-23 ENCOUNTER — Other Ambulatory Visit: Payer: Self-pay

## 2023-12-23 DIAGNOSIS — I10 Essential (primary) hypertension: Secondary | ICD-10-CM

## 2023-12-23 MED ORDER — LISINOPRIL 10 MG PO TABS: 10.0000 mg | ORAL_TABLET | Freq: Every day | ORAL | 1 refills | Status: DC

## 2023-12-23 NOTE — Telephone Encounter (Signed)
 Which strength of Lisinopril  should patient be taking?

## 2023-12-23 NOTE — Telephone Encounter (Signed)
 Copied from CRM [id number]. Topic: Clinical - Prescription Issue >> Dec 23, 2023 10:00 AM Phil Braun wrote: Reason for CRM: Sheri Woods with [facility] pharmacy called and needs to verify the correct dosage of  lisinopril  (ZESTRIL ) . Please give a call to her at [phone].

## 2023-12-31 ENCOUNTER — Encounter: Payer: Self-pay | Admitting: Family Medicine

## 2023-12-31 ENCOUNTER — Ambulatory Visit (INDEPENDENT_AMBULATORY_CARE_PROVIDER_SITE_OTHER): Admitting: Family Medicine

## 2023-12-31 VITALS — BP 131/89 | HR 98 | Temp 98.3°F | Ht 63.0 in | Wt 99.4 lb

## 2023-12-31 DIAGNOSIS — R6889 Other general symptoms and signs: Secondary | ICD-10-CM | POA: Diagnosis not present

## 2023-12-31 DIAGNOSIS — R051 Acute cough: Secondary | ICD-10-CM | POA: Diagnosis not present

## 2023-12-31 DIAGNOSIS — J441 Chronic obstructive pulmonary disease with (acute) exacerbation: Secondary | ICD-10-CM | POA: Diagnosis not present

## 2023-12-31 LAB — POCT INFLUENZA A/B
Influenza A, POC: NEGATIVE
Influenza B, POC: NEGATIVE

## 2023-12-31 LAB — POC COVID19 BINAXNOW: SARS Coronavirus 2 Ag: NEGATIVE

## 2023-12-31 MED ORDER — PREDNISONE 10 MG (48) PO TBPK: ORAL_TABLET | Freq: Every day | ORAL | 0 refills | Status: DC

## 2023-12-31 MED ORDER — ALBUTEROL SULFATE HFA 108 (90 BASE) MCG/ACT IN AERS: 2.0000 | INHALATION_SPRAY | Freq: Four times a day (QID) | RESPIRATORY_TRACT | 0 refills | Status: DC | PRN

## 2023-12-31 MED ORDER — DOXYCYCLINE HYCLATE 100 MG PO CAPS: 100.0000 mg | ORAL_CAPSULE | Freq: Two times a day (BID) | ORAL | 0 refills | Status: AC

## 2023-12-31 MED ORDER — HYDROCODONE BIT-HOMATROP MBR 5-1.5 MG/5ML PO SOLN: 5.0000 mL | Freq: Four times a day (QID) | ORAL | 0 refills | Status: DC | PRN

## 2023-12-31 MED ORDER — IPRATROPIUM-ALBUTEROL 0.5-2.5 (3) MG/3ML IN SOLN
3.0000 mL | Freq: Once | RESPIRATORY_TRACT | Status: AC
  Administered 2023-12-31: 3 mL via RESPIRATORY_TRACT

## 2023-12-31 NOTE — Progress Notes (Signed)
 Established Patient Office Visit  Subjective   Patient ID: Yaslin Kirtley, female    DOB: 25-Jul-1985  Age: 38 y.o. MRN: [id number]  Chief Complaint  Patient presents with   Cough    Cough and congestion x 4 days. Patient has body aches. She has been taking Tlenol but it does not help.    HPI Discussed the use of AI scribe software for clinical note transcription with the patient, who gave verbal consent to proceed.  History of Present Illness   Cheryn Lundquist is a 38 year old female who presents with generalized body aches, fever, and difficulty swallowing.  She has been experiencing generalized body aches, fever, and difficulty swallowing for the past four days. The pain is described as aching and throbbing, affecting her entire body, and has made it difficult for her to rest. She has been taking Tylenol  and Advil  but has difficulty swallowing Advil  and states that she 'vomits it right back up' after swallowing.  Her fever reached 102F last night and occurs primarily at night. She also reports wheezing, which has worsened over the last two days, and a swollen throat that makes it difficult to eat. Attempts to eat soft foods like tacos and tortillas have been unsuccessful due to throat discomfort.   She mentions coughing up yellow-green phlegm, which was worse at the beginning of her symptoms. She is frustrated over her inability to eat due to throat swelling and lack of financial resources to purchase soft foods.  She smokes but does not specify the frequency or amount. No history of the flu, but she is concerned about potentially having it now.           ROS    Objective:     BP 131/89   Pulse 98   Temp 98.3 F (36.8 C)   Ht 5' 3 (1.6 m)   Wt 99 lb 6.4 oz (45.1 kg)   SpO2 97%   BMI 17.61 kg/m    Physical Exam Vitals and nursing note reviewed.  Constitutional:      Appearance: Normal appearance.  HENT:     Head: Normocephalic and atraumatic.      Mouth/Throat:     Lips: Pink.     Mouth: Mucous membranes are moist.     Pharynx: Pharyngeal swelling present.     Tonsils: 1+ on the right. 1+ on the left.   Eyes:     Conjunctiva/sclera: Conjunctivae normal.    Cardiovascular:     Rate and Rhythm: Normal rate and regular rhythm.  Pulmonary:     Effort: Pulmonary effort is normal.     Breath sounds: Wheezing (whezzing improved after Duoneb) present.   Musculoskeletal:     Right lower leg: No edema.     Left lower leg: No edema.   Skin:    General: Skin is warm and dry.   Neurological:     Mental Status: She is alert and oriented to person, place, and time.   Psychiatric:        Mood and Affect: Mood normal.        Behavior: Behavior normal.        Thought Content: Thought content normal.        Judgment: Judgment normal.          Results for orders placed or performed in visit on 12/31/23  POCT Influenza A/B  Result Value Ref Range   Influenza A, POC Negative Negative   Influenza B, POC Negative Negative  POC COVID-19 BinaxNow  Result Value Ref Range   SARS Coronavirus 2 Ag Negative Negative      The ASCVD Risk score (Arnett DK, et al., 2019) failed to calculate for the following reasons:   Risk score cannot be calculated because patient has a medical history suggesting prior/existing ASCVD    Assessment & Plan:  Flu-like symptoms -     POCT Influenza A/B  Acute cough -     POC COVID-19 BinaxNow  COPD exacerbation (HCC) -     predniSONE; Take by mouth daily. 12-day taper pack, use as directed for taper  Dispense: 1 tablet; Refill: 0 -     Doxycycline  Hyclate; Take 1 capsule (100 mg total) by mouth 2 (two) times daily.  Dispense: 20 capsule; Refill: 0 -     HYDROcodone Bit-Homatrop MBr; Take 5 mLs by mouth every 6 (six) hours as needed for cough.  Dispense: 120 mL; Refill: 0 -     Albuterol Sulfate HFA; Inhale 2 puffs into the lungs every 6 (six) hours as needed for wheezing or shortness of breath.   Dispense: 8 g; Refill: 0 -     Ipratropium-Albuterol  COPD with acute exacerbation (HCC) Assessment & Plan: Likely triggered by viral infection.  She is influenza A/B negative and COVID-negative.  Prednisone Dosepak 10 mg for 12 days.  Doxycycline  100 mg twice daily.  Hycodan cough syrup 1 teaspoon every 4-6 as needed      Return if symptoms worsen or fail to improve.    Yeshaya Vath K Hamza Empson, MD

## 2023-12-31 NOTE — Assessment & Plan Note (Signed)
 Likely triggered by viral infection.  She is influenza A/B negative and COVID-negative.  Prednisone Dosepak 10 mg for 12 days.  Doxycycline  100 mg twice daily.  Hycodan cough syrup 1 teaspoon every 4-6 as needed

## 2024-01-22 ENCOUNTER — Other Ambulatory Visit: Payer: Self-pay | Admitting: Family Medicine

## 2024-01-22 DIAGNOSIS — J441 Chronic obstructive pulmonary disease with (acute) exacerbation: Secondary | ICD-10-CM

## 2024-02-06 DIAGNOSIS — R1031 Right lower quadrant pain: Secondary | ICD-10-CM | POA: Diagnosis not present

## 2024-02-06 DIAGNOSIS — Z5321 Procedure and treatment not carried out due to patient leaving prior to being seen by health care provider: Secondary | ICD-10-CM | POA: Diagnosis not present

## 2024-02-06 DIAGNOSIS — I10 Essential (primary) hypertension: Secondary | ICD-10-CM | POA: Diagnosis not present

## 2024-02-06 DIAGNOSIS — Z79899 Other long term (current) drug therapy: Secondary | ICD-10-CM | POA: Diagnosis not present

## 2024-02-06 DIAGNOSIS — Z880 Allergy status to penicillin: Secondary | ICD-10-CM | POA: Diagnosis not present

## 2024-02-11 ENCOUNTER — Ambulatory Visit: Admitting: Family Medicine

## 2024-02-12 NOTE — Telephone Encounter (Unsigned)
 Copied from CRM [id number]. Topic: Clinical - Medication Question >> Feb 12, 2024  4:20 PM Sasha H wrote: Reason for CRM: Pt has appointment on 8/4 but will be out of her medications before then, pt is wanting to know if she can have her meds sent in before then. Please reach out

## 2024-02-13 ENCOUNTER — Other Ambulatory Visit: Payer: Self-pay | Admitting: Family Medicine

## 2024-02-13 DIAGNOSIS — F419 Anxiety disorder, unspecified: Secondary | ICD-10-CM

## 2024-02-13 NOTE — Telephone Encounter (Signed)
 Copied from CRM [id number]. Topic: Clinical - Medication Refill >> Feb 13, 2024 11:42 AM Zebedee SAUNDERS wrote: Medication: ALPRAZolam  (XANAX ) 0.5 MG tablet  Has the patient contacted their pharmacy? Yes (Agent: If no, request that the patient contact the pharmacy for the refill. If patient does not wish to contact the pharmacy document the reason why and proceed with request.) (Agent: If yes, when and what did the pharmacy advise?)  This is the patient's preferred pharmacy:  [facility] - [place], [place] - [address] [place] [address] Phone: [phone] Fax: [phone]  Is this the correct pharmacy for this prescription? Yes If no, delete pharmacy and type the correct one.   Has the prescription been filled recently? Yes  Is the patient out of the medication? Yes  Has the patient been seen for an appointment in the last year OR does the patient have an upcoming appointment? Yes  Can we respond through MyChart? Yes  Agent: Please be advised that Rx refills may take up to 3 business days. We ask that you follow-up with your pharmacy.

## 2024-02-16 ENCOUNTER — Ambulatory Visit (INDEPENDENT_AMBULATORY_CARE_PROVIDER_SITE_OTHER): Admitting: Family Medicine

## 2024-02-16 ENCOUNTER — Encounter: Payer: Self-pay | Admitting: Family Medicine

## 2024-02-16 VITALS — BP 138/100 | HR 80 | Temp 98.2°F | Resp 20 | Ht 63.0 in | Wt 102.0 lb

## 2024-02-16 DIAGNOSIS — K0889 Other specified disorders of teeth and supporting structures: Secondary | ICD-10-CM | POA: Diagnosis not present

## 2024-02-16 DIAGNOSIS — F419 Anxiety disorder, unspecified: Secondary | ICD-10-CM

## 2024-02-16 DIAGNOSIS — I10 Essential (primary) hypertension: Secondary | ICD-10-CM | POA: Diagnosis not present

## 2024-02-16 DIAGNOSIS — J441 Chronic obstructive pulmonary disease with (acute) exacerbation: Secondary | ICD-10-CM

## 2024-02-16 MED ORDER — ALPRAZOLAM 0.5 MG PO TABS: 0.5000 mg | ORAL_TABLET | Freq: Two times a day (BID) | ORAL | 2 refills | Status: DC | PRN

## 2024-02-16 MED ORDER — LISINOPRIL 10 MG PO TABS: 10.0000 mg | ORAL_TABLET | Freq: Every day | ORAL | 1 refills | Status: DC

## 2024-02-16 MED ORDER — GABAPENTIN 100 MG PO CAPS: 200.0000 mg | ORAL_CAPSULE | Freq: Two times a day (BID) | ORAL | 2 refills | Status: AC

## 2024-02-16 MED ORDER — DOXYCYCLINE HYCLATE 100 MG PO CAPS: 100.0000 mg | ORAL_CAPSULE | Freq: Two times a day (BID) | ORAL | 0 refills | Status: AC

## 2024-02-16 MED ORDER — AMLODIPINE BESYLATE 5 MG PO TABS: 5.0000 mg | ORAL_TABLET | Freq: Every day | ORAL | 1 refills | Status: AC

## 2024-02-16 NOTE — Progress Notes (Unsigned)
 Established Patient Office Visit  Subjective   Patient ID: Sheri Woods, female    DOB: [birth year]  Age: 38 y.o. MRN: [id number]  Chief Complaint  Patient presents with   Medical Management of Chronic Issues    HPI Delightful 38 year old woman with anxiety, HTN, iron deficiency anemia, migraine headaches, psychogenic seizure disorder. She is on Xanax  for and generally takes a 0.5 mg twice a day she has a prescription for Zoloft  but has not had it recently.  She is very emotional in the office if she is going through a break-up with her boyfriend.  She states that she will take the Zoloft  for 3 to 4 days and get to feeling better emotionally and then stop taking it.  She denies suicidal and homicidal ideation.  Her PHQ-9 score is 1 and her GAD-7 score is 1. She did not take her blood pressure medication this morning.  She typically takes lisinopril  10 mg and amlodipine  5 mg daily.  She got up late and just forgot to take her medicine.        ROS    Objective:     BP (!) 138/100   Pulse 80   Temp 98.2 F (36.8 C) (Oral)   Resp 20   Ht 5' 3 (1.6 m)   Wt 102 lb (46.3 kg)   SpO2 98%   BMI 18.07 kg/m    Physical Exam Vitals reviewed.  Constitutional:      Appearance: Normal appearance.  HENT:     Head: Normocephalic.  Eyes:     General:        Right eye: No discharge.        Left eye: No discharge.  Cardiovascular:     Rate and Rhythm: Normal rate.  Pulmonary:     Effort: Pulmonary effort is normal.  Neurological:     Mental Status: She is alert and oriented to person, place, and time.  Psychiatric:        Mood and Affect: Mood normal.        Behavior: Behavior normal.        Thought Content: Thought content normal.        Judgment: Judgment normal.          No results found for any visits on 02/16/24.    The ASCVD Risk score (Arnett DK, et al., 2019) failed to calculate for the following reasons:   Risk score cannot be calculated because patient has  a medical history suggesting prior/existing ASCVD    Assessment & Plan:  Pain, dental -     Doxycycline  Hyclate; Take 1 capsule (100 mg total) by mouth 2 (two) times daily.  Dispense: 20 capsule; Refill: 0  Anxiety Assessment & Plan: Refilled her Xanax  0.5 mg twice daily #60 with 2 refills.  She is on scheduled to have her Xanax  refilled.  Encouraging her to take Zoloft  routinely instead of as needed.  Orders: -     ALPRAZolam ; Take 1 tablet (0.5 mg total) by mouth 2 (two) times daily as needed for anxiety.  Dispense: 60 tablet; Refill: 2  Hypertension, essential Assessment & Plan: She has prescriptions for amlodipine  5 mg and lisinopril  10 mg daily but she did not take her medicine today.  Typically she does take it.  Orders: -     amLODIPine  Besylate; Take 1 tablet (5 mg total) by mouth daily.  Dispense: 90 tablet; Refill: 1 -     Lisinopril ; Take 1 tablet (10 mg total) by  mouth daily. Patient to cut tablet in half.  Dispense: 90 tablet; Refill: 1  COPD exacerbation (HCC)  Other orders -     Gabapentin ; Take 2 capsules (200 mg total) by mouth 2 (two) times daily.  Dispense: 60 capsule; Refill: 2     Return in about 3 months (around 05/18/2024).    Nakeisha Greenhouse K Ceola Para, MD

## 2024-02-17 NOTE — Assessment & Plan Note (Signed)
 Refilled her Xanax  0.5 mg twice daily #60 with 2 refills.  She is on scheduled to have her Xanax  refilled.  Encouraging her to take Zoloft  routinely instead of as needed.

## 2024-02-17 NOTE — Assessment & Plan Note (Signed)
 She has prescriptions for amlodipine  5 mg and lisinopril  10 mg daily but she did not take her medicine today.  Typically she does take it.

## 2024-02-20 ENCOUNTER — Ambulatory Visit: Admitting: Podiatry

## 2024-02-20 ENCOUNTER — Ambulatory Visit: Admitting: Family Medicine

## 2024-03-14 ENCOUNTER — Emergency Department
Admission: EM | Admit: 2024-03-14 | Discharge: 2024-03-14 | Disposition: A | Discharge status: Home / Self Care | Attending: Emergency Medicine | Admitting: Emergency Medicine

## 2024-03-14 ENCOUNTER — Emergency Department

## 2024-03-14 ENCOUNTER — Other Ambulatory Visit: Payer: Self-pay

## 2024-03-14 DIAGNOSIS — R1032 Left lower quadrant pain: Secondary | ICD-10-CM | POA: Insufficient documentation

## 2024-03-14 DIAGNOSIS — N281 Cyst of kidney, acquired: Secondary | ICD-10-CM | POA: Diagnosis not present

## 2024-03-14 DIAGNOSIS — N649 Disorder of breast, unspecified: Secondary | ICD-10-CM | POA: Insufficient documentation

## 2024-03-14 DIAGNOSIS — D509 Iron deficiency anemia, unspecified: Secondary | ICD-10-CM | POA: Insufficient documentation

## 2024-03-14 DIAGNOSIS — K5730 Diverticulosis of large intestine without perforation or abscess without bleeding: Secondary | ICD-10-CM | POA: Diagnosis not present

## 2024-03-14 DIAGNOSIS — I10 Essential (primary) hypertension: Secondary | ICD-10-CM | POA: Diagnosis not present

## 2024-03-14 DIAGNOSIS — Z3202 Encounter for pregnancy test, result negative: Secondary | ICD-10-CM | POA: Diagnosis not present

## 2024-03-14 DIAGNOSIS — R112 Nausea with vomiting, unspecified: Secondary | ICD-10-CM | POA: Diagnosis not present

## 2024-03-14 DIAGNOSIS — N912 Amenorrhea, unspecified: Secondary | ICD-10-CM | POA: Diagnosis present

## 2024-03-14 LAB — LIPASE, BLOOD: Lipase: 46 U/L (ref 11–51)

## 2024-03-14 LAB — CBC
HCT: 39.3 % (ref 36.0–46.0)
Hemoglobin: 13.5 g/dL (ref 12.0–15.0)
MCH: 29.2 pg (ref 26.0–34.0)
MCHC: 34.4 g/dL (ref 30.0–36.0)
MCV: 85.1 fL (ref 80.0–100.0)
Platelets: 378 K/uL (ref 150–400)
RBC: 4.62 MIL/uL (ref 3.87–5.11)
RDW: 13.9 % (ref 11.5–15.5)
WBC: 11.4 K/uL — ABNORMAL HIGH (ref 4.0–10.5)
nRBC: 0 % (ref 0.0–0.2)

## 2024-03-14 LAB — COMPREHENSIVE METABOLIC PANEL WITH GFR
ALT: 17 U/L (ref 0–44)
AST: 19 U/L (ref 15–41)
Albumin: 4 g/dL (ref 3.5–5.0)
Alkaline Phosphatase: 55 U/L (ref 38–126)
Anion gap: 8 (ref 5–15)
BUN: 16 mg/dL (ref 6–20)
CO2: 21 mmol/L — ABNORMAL LOW (ref 22–32)
Calcium: 10 mg/dL (ref 8.9–10.3)
Chloride: 109 mmol/L (ref 98–111)
Creatinine, Ser: 0.46 mg/dL (ref 0.44–1.00)
GFR, Estimated: >60 mL/min (ref >60)
Glucose, Bld: 95 mg/dL (ref 70–99)
Potassium: 4.9 mmol/L (ref 3.5–5.1)
Sodium: 138 mmol/L (ref 135–145)
Total Bilirubin: 0.5 mg/dL (ref 0.0–1.2)
Total Protein: 8.1 g/dL (ref 6.5–8.1)

## 2024-03-14 LAB — URINALYSIS, ROUTINE W REFLEX MICROSCOPIC
Bilirubin Urine: NEGATIVE
Glucose, UA: NEGATIVE mg/dL
Hgb urine dipstick: NEGATIVE
Ketones, ur: NEGATIVE mg/dL
Leukocytes,Ua: NEGATIVE
Nitrite: NEGATIVE
Protein, ur: NEGATIVE mg/dL
Specific Gravity, Urine: 1.015 (ref 1.005–1.030)
pH: 9 — ABNORMAL HIGH (ref 5.0–8.0)

## 2024-03-14 LAB — HCG, QUANTITATIVE, PREGNANCY: hCG, Beta Chain, Quant, S: <1 m[IU]/mL (ref <5)

## 2024-03-14 LAB — POC URINE PREG, ED: Preg Test, Ur: NEGATIVE

## 2024-03-14 MED ORDER — ONDANSETRON HCL 4 MG/2ML IJ SOLN
4.0000 mg | Freq: Once | INTRAMUSCULAR | Status: AC
  Administered 2024-03-14: 4 mg via INTRAVENOUS
  Filled 2024-03-14: qty 2

## 2024-03-14 MED ORDER — MORPHINE SULFATE (PF) 4 MG/ML IV SOLN
4.0000 mg | Freq: Once | INTRAVENOUS | Status: AC
  Administered 2024-03-14: 4 mg via INTRAVENOUS
  Filled 2024-03-14: qty 1

## 2024-03-14 MED ORDER — ONDANSETRON 4 MG PO TBDP: 4.0000 mg | ORAL_TABLET | Freq: Three times a day (TID) | ORAL | 0 refills | Status: AC | PRN

## 2024-03-14 MED ORDER — DICYCLOMINE HCL 10 MG PO CAPS: 10.0000 mg | ORAL_CAPSULE | Freq: Three times a day (TID) | ORAL | 0 refills | Status: AC | PRN

## 2024-03-14 MED ORDER — IOHEXOL 300 MG/ML  SOLN
75.0000 mL | Freq: Once | INTRAMUSCULAR | Status: AC | PRN
  Administered 2024-03-14: 75 mL via INTRAVENOUS

## 2024-03-14 NOTE — ED Triage Notes (Signed)
 Pt is concerned for a tubal pregnancy with her LMP being last month for 2 days. Pt is having severe abdominal pain. Pt has had a tubal pregnancy before. Pt has not taken a pregnancy test, Pt has abdominal distention.

## 2024-03-14 NOTE — Discharge Instructions (Addendum)
 Take the Bentyl  as needed for pain and Zofran  as needed for nausea.  Return to the ER for new, worsening, or persistent severe pain, vomiting, fever, vaginal bleeding, weakness, or any other new or worsening symptoms that concern you.

## 2024-03-14 NOTE — ED Provider Notes (Signed)
 [facility] Provider Note    Event Date/Time   First MD Initiated Contact with Patient 03/14/24 1459     (approximate)   History   Abdominal Pain   HPI  Sheri Woods is a 38 y.o. female with a history of hypertension, iron deficiency anemia, migraine headaches, and psychogenic seizures who presents with left lower quadrant abdominal pain, acute onset today, sharp in quality, severe intensity, associated with a couple episodes of vomiting.  She denies any diarrhea.  She has no vaginal bleeding or discharge.  The patient states that she has had some breast fullness and nausea.  Her abdomen has been somewhat distended.  She states that the pain feels identical to when she had a tubal pregnancy before, so she is concerned she may be having the same thing.  However she has not had a positive pregnancy test at home.  I reviewed the past medical records.  The patient's most recent outpatient encounter was on 8/4 with family medicine for follow-up of her   Physical Exam   Triage Vital Signs: ED Triage Vitals  Encounter Vitals Group     BP 03/14/24 1425 (!) 115/91     Girls Systolic BP Percentile --      Girls Diastolic BP Percentile --      Boys Systolic BP Percentile --      Boys Diastolic BP Percentile --      Pulse Rate 03/14/24 1425 (!) 107     Resp 03/14/24 1425 16     Temp 03/14/24 1425 97.9 F (36.6 C)     Temp Source 03/14/24 1425 Oral     SpO2 03/14/24 1425 100 %     Weight 03/14/24 1426 105 lb (47.6 kg)     Height 03/14/24 1426 5' 2 (1.575 m)     Head Circumference --      Peak Flow --      Pain Score 03/14/24 1426 10     Pain Loc --      Pain Education --      Exclude from Growth Chart --     Most recent vital signs: Vitals:   03/14/24 1600 03/14/24 1700  BP: 116/85 (!) 127/90  Pulse: 77 77  Resp:  16  Temp:    SpO2: 100% 100%     General: Alert, comfortable appearing, no distress.  CV:  Good peripheral perfusion.   Resp:  Normal effort.  Abd:  Soft with mild left lower quadrant tenderness.  No distention.  Other:  No jaundice or scleral icterus.   ED Results / Procedures / Treatments   Labs (all labs ordered are listed, but only abnormal results are displayed) Labs Reviewed  COMPREHENSIVE METABOLIC PANEL WITH GFR - Abnormal; Notable for the following components:      Result Value   CO2 21 (*)    All other components within normal limits  CBC - Abnormal; Notable for the following components:   WBC 11.4 (*)    All other components within normal limits  URINALYSIS, ROUTINE W REFLEX MICROSCOPIC - Abnormal; Notable for the following components:   Color, Urine YELLOW (*)    APPearance CLOUDY (*)    pH 9.0 (*)    All other components within normal limits  POC URINE PREG, ED - Normal  LIPASE, BLOOD  HCG, QUANTITATIVE, PREGNANCY     EKG     RADIOLOGY  CT abdomen/pelvis: I independently viewed and interpreted the images; there are no dilated bowel  loops or any free air or free fluid.  Radiology report indicates no acute abnormalities.  IMPRESSION:  Diverticulosis.  No acute abdominal or pelvic pathology.     PROCEDURES:  Critical Care performed: No  Procedures   MEDICATIONS ORDERED IN ED: Medications  ondansetron  (ZOFRAN ) injection 4 mg (4 mg Intravenous Given 03/14/24 1541)  morphine  (PF) 4 MG/ML injection 4 mg (4 mg Intravenous Given 03/14/24 1541)  iohexol  (OMNIPAQUE ) 300 MG/ML solution 75 mL (75 mLs Intravenous Contrast Given 03/14/24 1639)     IMPRESSION / MDM / ASSESSMENT AND PLAN / ED COURSE  I reviewed the triage vital signs and the nursing notes.  38 year old female with PMH as noted above presents with left lower quadrant pain acute onset today.  She is concerned she may be pregnant and the pain is similar to her prior tubal pregnancy.  She is somewhat tender in the left lower quadrant.  Vital signs are normal.  Urine hCG is negative.  CMP, CBC, and lipase show no  acute findings.  Differential diagnosis includes, but is not limited to, diverticulitis, colitis, constipation, SBO, volvulus, gastroenteritis, ruptured ovarian cyst.  I have a low suspicion for torsion.  The pain is relatively high in the lower abdomen, and the patient is relatively comfortable appearing now.  We will check a serum hCG just to verify that the patient is not pregnant; if this is negative I will proceed with a CT of the abdomen.  Patient's presentation is most consistent with acute complicated illness / injury requiring diagnostic workup.  ----------------------------------------- 5:33 PM on 03/14/2024 -----------------------------------------  Serum hCG is negative.  CT shows no acute findings.  Overall I suspect some mild colitis or enteritis.  There is no evidence of acute gynecologic pathology; there are no adnexal masses or any free fluid.  The patient is comfortable on reassessment.  She is tolerating p.o.  At this time there is no indication for further ED workup.  She is stable for discharge home.  I counseled her on the results of workup and on return precautions, and she expressed understanding.     FINAL CLINICAL IMPRESSION(S) / ED DIAGNOSES   Final diagnoses:  Left lower quadrant abdominal pain     Rx / DC Orders   ED Discharge Orders          Ordered    ondansetron  (ZOFRAN -ODT) 4 MG disintegrating tablet  Every 8 hours PRN        03/14/24 1732    dicyclomine  (BENTYL ) 10 MG capsule  Every 8 hours PRN        03/14/24 1732             Note:  This document was prepared using Dragon voice recognition software and may include unintentional dictation errors.    Jacolyn Pae, MD 03/14/24 1734

## 2024-05-12 ENCOUNTER — Telehealth: Payer: Self-pay | Admitting: Family Medicine

## 2024-05-13 ENCOUNTER — Other Ambulatory Visit: Payer: Self-pay | Admitting: Family Medicine

## 2024-05-13 DIAGNOSIS — F419 Anxiety disorder, unspecified: Secondary | ICD-10-CM

## 2024-05-13 NOTE — Telephone Encounter (Signed)
 Pt called reporting that she is completely out of her current supply. Please advise

## 2024-06-07 ENCOUNTER — Other Ambulatory Visit: Payer: Self-pay | Admitting: Family Medicine

## 2024-06-07 DIAGNOSIS — F419 Anxiety disorder, unspecified: Secondary | ICD-10-CM

## 2024-06-07 DIAGNOSIS — I10 Essential (primary) hypertension: Secondary | ICD-10-CM

## 2024-06-07 NOTE — Telephone Encounter (Unsigned)
 Copied from CRM #[id number]. Topic: Clinical - Medication Refill >> Jun 07, 2024  4:29 PM Joesph B wrote: Medication: lisinopril  (ZESTRIL ) 10 MG tablet [[id number]]  ALPRAZolam  (XANAX ) 0.5 MG tablet    Has the patient contacted their pharmacy? Yes (Agent: If no, request that the patient contact the pharmacy for the refill. If patient does not wish to contact the pharmacy document the reason why and proceed with request.) (Agent: If yes, when and what did the pharmacy advise?)  This is the patient's preferred pharmacy:  [facility] - [place], [place] - [address] [place] [address] Phone: [phone] Fax: [phone]    Is this the correct pharmacy for this prescription? Yes If no, delete pharmacy and type the correct one.   Has the prescription been filled recently? Yes  Is the patient out of the medication? Yes  Has the patient been seen for an appointment in the last year OR does the patient have an upcoming appointment? Yes  Can we respond through MyChart? Yes  Agent: Please be advised that Rx refills may take up to 3 business days. We ask that you follow-up with your pharmacy.

## 2024-06-08 MED ORDER — LISINOPRIL 10 MG PO TABS: 5.0000 mg | ORAL_TABLET | Freq: Every day | ORAL | 1 refills | Status: AC

## 2024-06-08 MED ORDER — LISINOPRIL 10 MG PO TABS
10.0000 mg | ORAL_TABLET | Freq: Every day | ORAL | 1 refills | Status: DC
Start: 2024-06-08 — End: 2024-06-08

## 2024-06-08 MED ORDER — ALPRAZOLAM 0.5 MG PO TABS: 0.5000 mg | ORAL_TABLET | Freq: Two times a day (BID) | ORAL | 2 refills | Status: DC | PRN

## 2024-06-08 NOTE — Telephone Encounter (Signed)
 Copied from CRM #[id number]. Topic: Clinical - Prescription Issue >> Jun 08, 2024  1:22 PM Emylou G wrote: Reason for CRM: [facility] pharmacy called.. would like us  to change the wording of the script of lisinopril  (ZESTRIL ) 10 MG tablet to say: take half tablet by mouth daily .SABRA For insurance purposes - send electronically.. call them if you need anything [phone]

## 2024-06-08 NOTE — Telephone Encounter (Signed)
Corrected script has been sent to pharmacy.

## 2024-06-15 ENCOUNTER — Ambulatory Visit: Admitting: Family Medicine

## 2024-07-02 ENCOUNTER — Telehealth: Payer: Self-pay | Admitting: Family Medicine

## 2024-07-02 NOTE — Telephone Encounter (Unsigned)
 Copied from CRM #[id number]. Topic: Clinical - Refused Triage >> Jul 02, 2024 10:00 AM Joesph NOVAK wrote: Patient/caller voiced complaints of Pain is worsening in her joints/arms/legs. Declined transfer to triage.   ----------------------------------------------------------------------- From previous Reason for Contact - Refused Triage: Patient/caller voiced complaints of Pain is worsening in her joints/arms/legs. Declined transfer to triage.

## 2024-07-19 ENCOUNTER — Ambulatory Visit: Admitting: Family Medicine

## 2024-08-02 ENCOUNTER — Encounter: Payer: Self-pay | Admitting: Family Medicine

## 2024-08-02 ENCOUNTER — Ambulatory Visit: Admitting: Family Medicine

## 2024-08-02 VITALS — BP 126/88 | HR 97 | Ht 62.0 in | Wt 101.8 lb

## 2024-08-02 DIAGNOSIS — Z1231 Encounter for screening mammogram for malignant neoplasm of breast: Secondary | ICD-10-CM

## 2024-08-02 DIAGNOSIS — F419 Anxiety disorder, unspecified: Secondary | ICD-10-CM

## 2024-08-02 DIAGNOSIS — M62838 Other muscle spasm: Secondary | ICD-10-CM

## 2024-08-02 MED ORDER — SERTRALINE HCL 50 MG PO TABS: 50.0000 mg | ORAL_TABLET | Freq: Every day | ORAL | 3 refills | Status: AC

## 2024-08-02 MED ORDER — CYCLOBENZAPRINE HCL 10 MG PO TABS: 10.0000 mg | ORAL_TABLET | Freq: Three times a day (TID) | ORAL | 0 refills | Status: AC | PRN

## 2024-08-02 MED ORDER — ALPRAZOLAM 0.5 MG PO TABS: 0.5000 mg | ORAL_TABLET | Freq: Two times a day (BID) | ORAL | 2 refills | Status: AC | PRN

## 2024-09-10 ENCOUNTER — Ambulatory Visit
